# Patient Record
Sex: Male | Born: 1985 | Race: White | Hispanic: No | Marital: Married | Smoking: Current every day smoker
Health system: Southern US, Community
[De-identification: ages and names within clinical notes are randomized; demographics above are authoritative.]

## PROBLEM LIST (undated history)

## (undated) DIAGNOSIS — F99 Mental disorder, not otherwise specified: Secondary | ICD-10-CM

## (undated) DIAGNOSIS — F319 Bipolar disorder, unspecified: Secondary | ICD-10-CM

## (undated) DIAGNOSIS — M549 Dorsalgia, unspecified: Secondary | ICD-10-CM

## (undated) DIAGNOSIS — M1990 Unspecified osteoarthritis, unspecified site: Secondary | ICD-10-CM

## (undated) DIAGNOSIS — G8929 Other chronic pain: Secondary | ICD-10-CM

## (undated) HISTORY — PX: EYE SURGERY: SHX253

## (undated) HISTORY — PX: FRACTURE SURGERY: SHX138

---
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
## 2005-03-26 ENCOUNTER — Emergency Department: Payer: Self-pay | Admitting: Emergency Medicine

## 2005-08-25 ENCOUNTER — Inpatient Hospital Stay (HOSPITAL_COMMUNITY): Admission: EM | Admit: 2005-08-25 | Discharge: 2005-08-28 | Payer: Self-pay | Admitting: *Deleted

## 2005-08-26 ENCOUNTER — Ambulatory Visit: Payer: Self-pay | Admitting: *Deleted

## 2005-12-12 ENCOUNTER — Inpatient Hospital Stay (HOSPITAL_COMMUNITY): Admission: EM | Admit: 2005-12-12 | Discharge: 2005-12-15 | Payer: Self-pay | Admitting: Emergency Medicine

## 2005-12-20 ENCOUNTER — Ambulatory Visit (HOSPITAL_COMMUNITY): Admission: RE | Admit: 2005-12-20 | Discharge: 2005-12-20 | Payer: Self-pay | Admitting: Orthopedic Surgery

## 2005-12-24 ENCOUNTER — Ambulatory Visit (HOSPITAL_COMMUNITY): Admission: RE | Admit: 2005-12-24 | Discharge: 2005-12-24 | Payer: Self-pay | Admitting: Orthopedic Surgery

## 2006-10-26 ENCOUNTER — Emergency Department: Payer: Self-pay | Admitting: Emergency Medicine

## 2006-11-16 ENCOUNTER — Other Ambulatory Visit: Payer: Self-pay

## 2006-11-16 ENCOUNTER — Emergency Department: Payer: Self-pay | Admitting: Emergency Medicine

## 2007-03-04 ENCOUNTER — Emergency Department: Payer: Self-pay | Admitting: Emergency Medicine

## 2007-03-06 ENCOUNTER — Ambulatory Visit: Payer: Self-pay | Admitting: Family Medicine

## 2007-03-19 ENCOUNTER — Ambulatory Visit: Payer: Self-pay | Admitting: Gastroenterology

## 2007-05-30 ENCOUNTER — Ambulatory Visit: Payer: Self-pay | Admitting: Gastroenterology

## 2007-09-08 ENCOUNTER — Emergency Department: Payer: Self-pay | Admitting: Emergency Medicine

## 2008-05-14 ENCOUNTER — Emergency Department (HOSPITAL_COMMUNITY): Admission: EM | Admit: 2008-05-14 | Discharge: 2008-05-14 | Payer: Self-pay | Admitting: Emergency Medicine

## 2010-04-07 ENCOUNTER — Emergency Department: Payer: Self-pay | Admitting: Emergency Medicine

## 2010-05-17 LAB — POCT I-STAT, CHEM 8
Calcium, Ion: 1.25 mmol/L (ref 1.12–1.32)
Chloride: 102 mEq/L (ref 96–112)
Creatinine, Ser: 1.2 mg/dL (ref 0.4–1.5)
Glucose, Bld: 105 mg/dL — ABNORMAL HIGH (ref 70–99)
Potassium: 3.6 mEq/L (ref 3.5–5.1)

## 2010-05-17 LAB — RAPID URINE DRUG SCREEN, HOSP PERFORMED
Barbiturates: NOT DETECTED
Opiates: NOT DETECTED
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
## 2011-01-31 ENCOUNTER — Ambulatory Visit: Payer: Self-pay | Admitting: Vascular Surgery

## 2011-02-12 LAB — PATHOLOGY REPORT

## 2011-11-02 ENCOUNTER — Emergency Department: Payer: Self-pay | Admitting: Internal Medicine

## 2011-11-03 ENCOUNTER — Emergency Department (HOSPITAL_COMMUNITY)
Admission: EM | Admit: 2011-11-03 | Discharge: 2011-11-04 | Disposition: A | Discharge status: Home / Self Care | Payer: Self-pay | Attending: Emergency Medicine | Admitting: Emergency Medicine

## 2011-11-03 ENCOUNTER — Encounter (HOSPITAL_COMMUNITY): Payer: Self-pay | Admitting: *Deleted

## 2011-11-03 ENCOUNTER — Emergency Department (HOSPITAL_COMMUNITY): Payer: Self-pay

## 2011-11-03 DIAGNOSIS — IMO0002 Reserved for concepts with insufficient information to code with codable children: Secondary | ICD-10-CM | POA: Insufficient documentation

## 2011-11-03 DIAGNOSIS — F29 Unspecified psychosis not due to a substance or known physiological condition: Secondary | ICD-10-CM | POA: Insufficient documentation

## 2011-11-03 DIAGNOSIS — R4182 Altered mental status, unspecified: Secondary | ICD-10-CM | POA: Insufficient documentation

## 2011-11-03 LAB — CBC
HCT: 41.4 % (ref 39.0–52.0)
Hemoglobin: 14.4 g/dL (ref 13.0–17.0)
MCH: 31.3 pg (ref 26.0–34.0)
MCHC: 34.8 g/dL (ref 30.0–36.0)
MCV: 90 fL (ref 78.0–100.0)
RDW: 13.6 % (ref 11.5–15.5)

## 2011-11-03 LAB — COMPREHENSIVE METABOLIC PANEL
Alkaline Phosphatase: 53 U/L (ref 39–117)
BUN: 16 mg/dL (ref 6–23)
Calcium: 9.3 mg/dL (ref 8.4–10.5)
Creatinine, Ser: 1.02 mg/dL (ref 0.50–1.35)
GFR calc Af Amer: >90 mL/min (ref >90)
Glucose, Bld: 90 mg/dL (ref 70–99)
Total Protein: 6.7 g/dL (ref 6.0–8.3)

## 2011-11-03 LAB — RAPID URINE DRUG SCREEN, HOSP PERFORMED
Amphetamines: POSITIVE — AB
Benzodiazepines: NOT DETECTED
Cocaine: NOT DETECTED
Opiates: NOT DETECTED

## 2011-11-03 MED ORDER — ZIPRASIDONE MESYLATE 20 MG IM SOLR
10.0000 mg | Freq: Once | INTRAMUSCULAR | Status: AC
  Administered 2011-11-03: 10 mg via INTRAMUSCULAR
  Filled 2011-11-03: qty 20

## 2011-11-03 MED ORDER — LORAZEPAM 1 MG PO TABS
1.0000 mg | ORAL_TABLET | Freq: Three times a day (TID) | ORAL | Status: DC | PRN
  Administered 2011-11-03: 1 mg via ORAL
  Filled 2011-11-03: qty 1

## 2011-11-03 MED ORDER — ACETAMINOPHEN 325 MG PO TABS
650.0000 mg | ORAL_TABLET | Freq: Once | ORAL | Status: AC
  Administered 2011-11-03: 650 mg via ORAL
  Filled 2011-11-03: qty 2

## 2011-11-03 MED ORDER — NICOTINE 21 MG/24HR TD PT24
21.0000 mg | MEDICATED_PATCH | Freq: Every day | TRANSDERMAL | Status: DC
  Filled 2011-11-03: qty 1

## 2011-11-03 NOTE — ED Notes (Signed)
Pt reports that he use to go to [facility] for methadone, but now buys it off of the street and is using  Three 10 mg a day to treat his back pain.  Nad, resting quielty

## 2011-11-03 NOTE — ED Notes (Signed)
Pt complaining of chest pain. ekg ordered per protocol. Vw, rn.

## 2011-11-03 NOTE — ED Notes (Addendum)
Pt was picked up at [facility] by [employer].  He had apparently been taken to [facility] office.  Pt became combative at [facility] and called [employer].  Pt was originally sent to [facility] after his parents took IVC papers on him because he apparently planned to jump off a bridge (according to documents [employer] brought from [place]).

## 2011-11-03 NOTE — ED Notes (Signed)
Pt moved to [id number].  Pt belongings in locker #35

## 2011-11-03 NOTE — ED Notes (Signed)
Back from CT-pt cooperated well per tech.
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
## 2011-11-03 NOTE — ED Notes (Signed)
Report given to Wille Celeste, rn in psyche ED. Vw, rn

## 2011-11-03 NOTE — ED Notes (Signed)
Pt placed in blue scrubs  

## 2011-11-03 NOTE — ED Notes (Signed)
Patient has a visitor at this time. NAD

## 2011-11-03 NOTE — ED Provider Notes (Addendum)
History     [name]: [id number]  Arrival date & time 11/03/11  1336   First MD Initiated Contact with Patient 11/03/11 1338      Chief Complaint  Patient presents with  . Medical Clearance    (Consider location/radiation/quality/duration/timing/severity/associated sxs/prior treatment) The history is provided by the patient.  pt from [facility], arrives w gpd and commitment papers.  Pt very uncooperative, agitated. Commitment papers state pt recently hearing voices, thoughts of suicide, mentioning plan to intentionally crash vehicle. Level 5 caveat/pt uncooperative w history/?mental illness.      History reviewed. No pertinent past medical history.  History reviewed. No pertinent past surgical history.  No family history on file.  History  Substance Use Topics  . Smoking status: Not on file  . Smokeless tobacco: Not on file  . Alcohol Use: Not on file      Review of Systems  Unable to perform ROS: Psychiatric disorder  level 5 caveat  Allergies  Other  Home Medications  No current outpatient prescriptions on file.  BP 140/91  Pulse 89  Temp 99.1 F (37.3 C) (Oral)  Resp 20  SpO2 100%  Physical Exam  Nursing note and vitals reviewed. Constitutional: He appears well-developed and well-nourished. No distress.  HENT:  Head: Atraumatic.  Nose: Nose normal.  Mouth/Throat: Oropharynx is clear and moist.  Eyes: Conjunctivae normal are normal. Pupils are equal, round, and reactive to light. No scleral icterus.  Neck: Neck supple. No tracheal deviation present.  Cardiovascular: Normal rate, regular rhythm, normal heart sounds and intact distal pulses.   Pulmonary/Chest: Effort normal and breath sounds normal. No accessory muscle usage. No respiratory distress. He exhibits no tenderness.  Abdominal: Soft. Bowel sounds are normal. He exhibits no distension. There is no tenderness.  Musculoskeletal: Normal range of motion. He exhibits no edema and no tenderness.    Neurological: He is alert.       Alert, moving bil extremities purposefully with good strength. Uncooperative w exam.   Skin: Skin is warm and dry. No rash noted.  Psychiatric:       Pt agitated. Insulting staff. Uncooperative.     ED Course  Procedures (including critical care time)   Labs Reviewed  CBC  COMPREHENSIVE METABOLIC PANEL  ETHANOL  URINE RAPID DRUG SCREEN (HOSP PERFORMED)    Results for orders placed during the hospital encounter of 11/03/11  CBC      Component Value Range   WBC 7.7  4.0 - 10.5 K/uL   RBC 4.60  4.22 - 5.81 MIL/uL   Hemoglobin 14.4  13.0 - 17.0 g/dL   HCT 78.2  95.6 - 21.3 %   MCV 90.0  78.0 - 100.0 fL   MCH 31.3  26.0 - 34.0 pg   MCHC 34.8  30.0 - 36.0 g/dL   RDW 08.6  57.8 - 46.9 %   Platelets 181  150 - 400 K/uL  COMPREHENSIVE METABOLIC PANEL      Component Value Range   Sodium 136  135 - 145 mEq/L   Potassium 4.2  3.5 - 5.1 mEq/L   Chloride 100  96 - 112 mEq/L   CO2 27  19 - 32 mEq/L   Glucose, Bld 90  70 - 99 mg/dL   BUN 16  6 - 23 mg/dL   Creatinine, Ser 6.29  0.50 - 1.35 mg/dL   Calcium 9.3  8.4 - 52.8 mg/dL   Total Protein 6.7  6.0 - 8.3 g/dL   Albumin 4.5  3.5 - 5.2 g/dL   AST 17  0 - 37 U/L   ALT 16  0 - 53 U/L   Alkaline Phosphatase 53  39 - 117 U/L   Total Bilirubin 0.5  0.3 - 1.2 mg/dL   GFR calc non Af Amer >90  >90 mL/min   GFR calc Af Amer >90  >90 mL/min  ETHANOL      Component Value Range   Alcohol, Ethyl (B) <11  0 - 11 mg/dL       MDM  Pt attempted to be calm by ed staff and gpd, pt remains agitated/aggressive/uncooperative. geodon 10 mg im for agitation/psychosis.  Labs. Act consult. Will get telepsych eval.  Recheck calmer, alert. Nad.  telepsych and act eval pending. Signed out to Dr Jeraldine Loots that pt will need psych eval/admit.     Date: 11/03/2011  Rate: 102  Rhythm: sinus tachycardia  QRS Axis: normal  Intervals: normal  ST/T Wave abnormalities: normal  Conduction Disutrbances:none   Narrative Interpretation:   Old EKG Reviewed: unchanged          Suzi Roots, MD 11/03/11 1518  Suzi Roots, MD 11/04/11 [phone]

## 2011-11-03 NOTE — ED Notes (Signed)
To ct-security and gpd are with pt

## 2011-11-03 NOTE — ED Notes (Signed)
Dc criteria for restraint met; pt taken out of restraint. Transferred to psyche ED  rm 35.
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
## 2011-11-03 NOTE — ED Notes (Signed)
Up to the phone 

## 2011-11-03 NOTE — ED Notes (Signed)
Sitting on the bed, appears angry.  Pt is aware that he is under IVC and discussed with him that the doctors are the only one's that can release him, pt verbalized understanding and layed down.  Pt also reports that he has back pain 10/10/ that he takes percocet for.

## 2011-11-04 ENCOUNTER — Emergency Department (HOSPITAL_COMMUNITY)
Admission: EM | Admit: 2011-11-04 | Discharge: 2011-11-08 | Disposition: A | Discharge status: Home / Self Care | Payer: Self-pay | Attending: Emergency Medicine | Admitting: Emergency Medicine

## 2011-11-04 ENCOUNTER — Emergency Department (HOSPITAL_COMMUNITY): Payer: Self-pay

## 2011-11-04 ENCOUNTER — Emergency Department (HOSPITAL_COMMUNITY)
Admission: EM | Admit: 2011-11-04 | Discharge: 2011-11-04 | Disposition: A | Discharge status: Home / Self Care | Payer: Self-pay | Attending: Emergency Medicine | Admitting: Emergency Medicine

## 2011-11-04 ENCOUNTER — Encounter (HOSPITAL_COMMUNITY): Payer: Self-pay | Admitting: Emergency Medicine

## 2011-11-04 DIAGNOSIS — F1910 Other psychoactive substance abuse, uncomplicated: Secondary | ICD-10-CM | POA: Insufficient documentation

## 2011-11-04 DIAGNOSIS — Z0389 Encounter for observation for other suspected diseases and conditions ruled out: Secondary | ICD-10-CM | POA: Insufficient documentation

## 2011-11-04 DIAGNOSIS — Z046 Encounter for general psychiatric examination, requested by authority: Secondary | ICD-10-CM | POA: Insufficient documentation

## 2011-11-04 DIAGNOSIS — F29 Unspecified psychosis not due to a substance or known physiological condition: Secondary | ICD-10-CM | POA: Insufficient documentation

## 2011-11-04 HISTORY — DX: Mental disorder, not otherwise specified: F99

## 2011-11-04 LAB — RAPID URINE DRUG SCREEN, HOSP PERFORMED
Benzodiazepines: NOT DETECTED
Cocaine: NOT DETECTED
Opiates: NOT DETECTED

## 2011-11-04 LAB — COMPREHENSIVE METABOLIC PANEL
BUN: 13 mg/dL (ref 6–23)
CO2: 28 mEq/L (ref 19–32)
Calcium: 9.8 mg/dL (ref 8.4–10.5)
Chloride: 102 mEq/L (ref 96–112)
Creatinine, Ser: 0.93 mg/dL (ref 0.50–1.35)
GFR calc non Af Amer: >90 mL/min (ref >90)
Total Bilirubin: 0.5 mg/dL (ref 0.3–1.2)

## 2011-11-04 LAB — CBC
Hemoglobin: 14.4 g/dL (ref 13.0–17.0)
Platelets: 186 10*3/uL (ref 150–400)
RBC: 4.61 MIL/uL (ref 4.22–5.81)
WBC: 6.8 10*3/uL (ref 4.0–10.5)

## 2011-11-04 LAB — ETHANOL: Alcohol, Ethyl (B): <11 mg/dL (ref 0–11)

## 2011-11-04 MED ORDER — LORAZEPAM 1 MG PO TABS
1.0000 mg | ORAL_TABLET | Freq: Three times a day (TID) | ORAL | Status: DC | PRN
  Administered 2011-11-04 – 2011-11-07 (×4): 1 mg via ORAL
  Filled 2011-11-04 (×6): qty 1

## 2011-11-04 MED ORDER — ACETAMINOPHEN 325 MG PO TABS
650.0000 mg | ORAL_TABLET | ORAL | Status: DC | PRN
Start: 2011-11-04 — End: 2011-11-08
  Administered 2011-11-06: 650 mg via ORAL
  Filled 2011-11-04: qty 2

## 2011-11-04 NOTE — ED Notes (Signed)
Pt in room talking w. Security laughing, smiling.

## 2011-11-04 NOTE — BH Assessment (Signed)
Assessment Note   James Walsh is an 26 y.o. male who presents involuntarily to [facility] Emergency Department with the c/o of persecutory delusions and manic behaviors. Patient's parents provided clinician with background information in regards to patient's current behaviors. Patient's mother reported that since this past Wednesday pt has exhibited paranoia, persecutory delusions, and suicidal ideations stating that he desires to end his life by driving his vehicle off of a bridge. Patient's mother reported that patient has stated that his parents have cameras in his house, watching his every move. Patient's mother stated that patient has received inpatient treatment before at [facility] due to depression. Per his mother, his uncle who he was extremely close to committed suicide when [place] was 26 years old. Patient's father reported that pt has been saying bizarre statements, referring to individuals that he has not seen or spoken to in several years. "He asked me this morning. "Do you want me to apologize to Lequita Halt?" He has not dated her since high school and it was unusual for him to say something like that".  Patient's mother reported that pt exhibits depressive symptoms, evidenced by insomnia, lack of appetite, and social isolation. Per patient's father pt has been abusing methadone, adderal, and xanax with no prescription provided by a MD. Patient's mother reported that pt has a girlfriend who is expecting twins. Patient's mother is concerned about pt's recent change in memory and overall demeanor. Patient has a noted history in the past of writing a suicidal note to his parents, delineating that he cannot do this [life] anymore. Patient is unable to contract for safety and requires further evaluation and treatment at this time. Patient denies HI/AVH.     Axis I: Bipolar, mixed and Polysubstance Dependence Axis II: Deferred Axis III: History reviewed. No pertinent past  medical history. Axis IV: other psychosocial or environmental problems, problems related to social environment and problems with primary support group Axis V: 41-50 serious symptoms  Past Medical History: History reviewed. No pertinent past medical history.  History reviewed. No pertinent past surgical history.  Family History: No family history on file.  Social History:  does not have a smoking history on file. He does not have any smokeless tobacco history on file. He reports that he drinks about 1.2 ounces of alcohol per week. He reports that he uses illicit drugs.  Additional Social History:  Alcohol / Drug Use Pain Medications: See MAR  Prescriptions: See MAR- Mother reports pt has been abusing xanax and adderall Over the Counter: See MAR  History of alcohol / drug use?: Yes Substance #1 Name of Substance 1: Xanax 1 - Age of First Use: Unknown 1 - Amount (size/oz): Unknown 1 - Frequency: Daily 1 - Duration: Unknown 1 - Last Use / Amount: Unknown Substance #2 Name of Substance 2: THC 2 - Age of First Use: Unknown 2 - Amount (size/oz): Unknown 2 - Frequency: Unknown 2 - Duration: Unknown 2 - Last Use / Amount: Pt denies. UDS is positive for it Substance #3 Name of Substance 3: Amphetamines 3 - Age of First Use: unknown 3 - Amount (size/oz): unknown 3 - Frequency: unknown 3 - Duration: unknown 3 - Last Use / Amount: Positve UDS  CIWA: CIWA-Ar BP: 117/82 mmHg Pulse Rate: 81  COWS:    Allergies:  Allergies  Allergen Reactions  . Other Other (See Comments)    Patient was ask if he has any allergies to medication or food. He stated that he was  allergic to "everything" with no specific names given. It is unknown if he has allergies.    Home Medications:  (Not in a hospital admission)  OB/GYN Status:  No LMP for male patient.  General Assessment Data Location of Assessment: [facility] ED Living Arrangements: Spouse/significant other Can pt return to current living  arrangement?: Yes Admission Status: Involuntary Is patient capable of signing voluntary admission?: No Transfer from: Acute Hospital Referral Source:  Vesta Mixer)     Risk to self Suicidal Ideation: Yes-Currently Present Suicidal Intent: No-Not Currently/Within Last 6 Months Is patient at risk for suicide?: Yes Suicidal Plan?: Yes-Currently Present Specify Current Suicidal Plan: Plan to drive his vehicle off bridge Access to Means: Yes Specify Access to Suicidal Means: Vehicle What has been your use of drugs/alcohol within the last 12 months?: Pt denies SA use  Previous Attempts/Gestures: Yes How many times?: 1  Other Self Harm Risks: Uses Xanax and Adderall without prescription Triggers for Past Attempts: Family contact (Uncle committed suicide. Depressed from that occurrence) Intentional Self Injurious Behavior: None Family Suicide History: Yes (Uncle committed suicide when pt was 69) Recent stressful life event(s): Financial Problems;Other (Comment) (pt's gf is having twins) Persecutory voices/beliefs?: Yes Depression: Yes Depression Symptoms: Tearfulness;Feeling angry/irritable;Insomnia Substance abuse history and/or treatment for substance abuse?: Yes Suicide prevention information given to non-admitted patients: Not applicable  Risk to Others Homicidal Ideation: No Thoughts of Harm to Others: No Current Homicidal Intent: No Current Homicidal Plan: No Access to Homicidal Means: No Identified Victim: None reported History of harm to others?: Yes Assessment of Violence: In past 6-12 months Violent Behavior Description: Was restrained during yesterday's admission Does patient have access to weapons?: No Criminal Charges Pending?: No Does patient have a court date: No  Psychosis Hallucinations: None noted Delusions: Persecutory (Pt believes his family is out to get him. )  Mental Status Report Appear/Hygiene: Disheveled Eye Contact: Poor Motor Activity: Freedom of  movement Speech: Logical/coherent Level of Consciousness: Quiet/awake Mood: Depressed;Anxious Affect: Appropriate to circumstance;Anxious;Depressed Anxiety Level: Moderate Thought Processes: Relevant Judgement: Impaired Orientation: Person;Place;Time;Situation Obsessive Compulsive Thoughts/Behaviors: Moderate  Cognitive Functioning Concentration: Decreased Memory: Recent Impaired;Remote Intact IQ: Average Insight: Poor Impulse Control: Poor Appetite: Poor Weight Loss:  (Mother reports he has lost several pounds) Weight Gain: 0  Sleep: Decreased Total Hours of Sleep: 2  Vegetative Symptoms: None  ADLScreening [facility] Assessment Services) Patient's cognitive ability adequate to safely complete daily activities?: Yes Patient able to express need for assistance with ADLs?: Yes Independently performs ADLs?: Yes (appropriate for developmental age)  Abuse/Neglect [facility]) Physical Abuse: Denies Verbal Abuse: Denies Sexual Abuse: Denies  Prior Inpatient Therapy Prior Inpatient Therapy: Yes Prior Therapy Dates: 2001 Prior Therapy Facilty/Provider(s): [facility] Reason for Treatment: Depression  Prior Outpatient Therapy Prior Outpatient Therapy: Yes Prior Therapy Dates: 2001 Prior Therapy Facilty/Provider(s): Unknown Reason for Treatment: Depression  ADL Screening (condition at time of admission) Patient's cognitive ability adequate to safely complete daily activities?: Yes Patient able to express need for assistance with ADLs?: Yes Independently performs ADLs?: Yes (appropriate for developmental age) Weakness of Legs: None Weakness of Arms/Hands: None  Home Assistive Devices/Equipment Home Assistive Devices/Equipment: None    Abuse/Neglect Assessment (Assessment to be complete while patient is alone) Physical Abuse: Denies Verbal Abuse: Denies Sexual Abuse: Denies Exploitation of patient/patient's resources: Denies Self-Neglect: Denies Values / Beliefs Cultural Requests  During Hospitalization: None Spiritual Requests During Hospitalization: None Consults Spiritual Care Consult Needed: No Social Work Consult Needed: No      Additional Information 1:1 In Past  12 Months?: No CIRT Risk: No Elopement Risk: No Does patient have medical clearance?: Yes     Disposition: Recommendation for further evaluation with Specialist on Call and inpatient treatment. Disposition Disposition of Patient: Inpatient treatment program Type of inpatient treatment program: Adult  On Site Evaluation by:   Reviewed with Physician:     Paulino Door, Earl Lites C 11/04/2011 3:26 PM

## 2011-11-04 NOTE — ED Notes (Signed)
[id number]> Expected date:11/04/11<BR> Expected time:<BR> Means of arrival:<BR> Comments:<BR> IVC per [employer]

## 2011-11-04 NOTE — ED Notes (Addendum)
Sitting in room, tearful, angy refuses dinner, refuses ativan, reasurred. Pt had spoken to his mother on the phone  And had become angry and tearful.
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
## 2011-11-04 NOTE — ED Notes (Signed)
sitting in room crying.  Pt reports he "just wants to go...see my girlfriend...see my real family.  Pt declined ativan "I don;t know if it's really ativan."  Reasurred

## 2011-11-04 NOTE — ED Notes (Signed)
Calmer, declined to take ativan, nad, smiling

## 2011-11-04 NOTE — ED Notes (Signed)
CSW was contacted by Consulting civil engineer in regards to a phone call received by pt's mother. Pt's mother reported to Charge RN that she was concerned about pt being d/c last night due to his aggressive behavior and paranoia. Pt's mother reported to Charge RN that she would like to speak with whomever was involved in pt's care last night during his ED admission. Consulting civil engineer and CSW telephoned pt's mother to address her concerns. Pt's mother verbalized to Consulting civil engineer and CSW that pt continues to exhibit paranoia in addition to aggressive behavior now at the home. Pt's mother was tearful and emotionally distressed during the conversation. Consulting civil engineer and CSW encouraged pt's mother to bring pt back to [facility] Emergency Department for additional evaluation and treatment. Pt's mother stated that pt will not go back to the ED because he feels that he does not have any physical issues. Consulting civil engineer and CSW overheard pt in the background yelling "Who are you talking to?!! Why are you plotting against me?!?!" Charge RN and CSW verbalized safety concerns of the pt's mother while she is in the home with pt. Charge RN received permission from pt's mother to contact [employer] in order to send out assistance to the home and bring pt in for further evaluation. Charge RN contacted onsite [employer] officer who coordinated dispatching a [facility] to the residence to provide assistance. Consulting civil engineer and CSW will remain available to provide assistance upon arrival of pt and/or pt's mother.   Janann Colonel., MSW, [facility] Clinical Social Worker [phone]

## 2011-11-04 NOTE — ED Notes (Signed)
Followed up with mother at request of [employer] office to make sure someone has arrived to assist. She has called 911 herself  Twice due to pts behavior and safety. Mother distressed, pt in background yelling. Mother is planning to call again after this phone call. Plan to remain in contact with mother until arrival of pt.

## 2011-11-04 NOTE — ED Notes (Signed)
Made on call MD aware of information related to methadone clinic per patients mother.

## 2011-11-04 NOTE — ED Provider Notes (Signed)
Per RN - patient significantly more cooperative.  AOx3.  Per Psych - the patient will be d/c.    Gerhard Munch, MD 11/04/11 [phone]

## 2011-11-04 NOTE — ED Notes (Signed)
Patient mother called to make staff aware of seizure hx, and patient has been attending methadone clinic ([facility]) [address]. Has not been there in a couple of weeks.

## 2011-11-04 NOTE — ED Notes (Signed)
Pt's belongings placed in locker 35. 2 bags.

## 2011-11-04 NOTE — ED Notes (Addendum)
Mom's cell [phone]

## 2011-11-04 NOTE — ED Notes (Signed)
telepsych info called in to [facility]

## 2011-11-04 NOTE — ED Notes (Signed)
Patient discharge to home with steady gait. Respirations equal and unlabored. Skin warm and dry. No acute distress noted.

## 2011-11-04 NOTE — ED Notes (Signed)
I have place the patient in Blue scrubs size medium and while in bathroom he gave me some urine. In his patient belonging bag I have placed blue jeans, 2 belts, hat, orange T-shirt, black socks, 2 pair of shoes and boxer shorts. His 2 bags are at the nursing station.

## 2011-11-04 NOTE — ED Notes (Signed)
[id number]> Expected date:<BR> Expected time:<BR> Means of arrival:<BR> Comments:<BR> hold
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
## 2011-11-04 NOTE — ED Notes (Signed)
Tele psych MD called for evaluation with patient. Made MD aware that this will be postpone to 9/30, It will be a face to face ( ? Parent (mother)  To be involved.  Made charge nurse aware and tele psych. MD stated call back if needed

## 2011-11-04 NOTE — ED Notes (Signed)
Followed up with mother, she was yelling he has run out the and hung up the phone. Unable to verify if [facility] has arrived.

## 2011-11-04 NOTE — ED Provider Notes (Signed)
History     [name]: [id number]  Arrival date & time 11/04/11  1338   First MD Initiated Contact with Patient 11/04/11 1410     Chief complaint:  Strange behavior   (Consider location/radiation/quality/duration/timing/severity/associated sxs/prior treatment) The history is provided by the patient and a parent. The history is limited by the condition of the patient.  pt with ?hx bipolar disorder presents w parents/law enforcement. Pt was in ED yesterday w ivc per parents, on psychiatrist eval was released from ivc and sent home. Parents concerned as psychiatrist had not discussed pt with them, and pt denies any issues/symptoms. Parents state in past week has had paranoid thoughts, feeling others are spying on him, taking his things, taking his money. Has threatened to harm self, made recurrent references to wanting it all to end.  Has been unable to function at work. Has exhibit for him odd behaviors including picking up objects/trash and putting in pockets. Pt/family deny recent known drug or alcohol abuse. Denies any recent unusual stressors. No recent illness. Pt denies any c/o. No headaches. No cp or sob. No abd pain. No nvd. Normal appetite. Level 5 caveat, pt uncooperative w hx.   No past medical history on file.  No past surgical history on file.  No family history on file.  History  Substance Use Topics  . Smoking status: Not on file  . Smokeless tobacco: Not on file  . Alcohol Use: Not on file      Review of Systems  Unable to perform ROS: Psychiatric disorder  level 5 caveat. Pt denies any c/o.   Allergies  Other  Home Medications   Current Outpatient Rx  Name Route Sig Dispense Refill  . OXYCODONE-ACETAMINOPHEN 7.5-325 MG PO TABS Oral Take 1 tablet by mouth every 4 (four) hours as needed. For pain.    Marland Kitchen OXYMETAZOLINE HCL 0.05 % NA SOLN Nasal Place 2 sprays into the nose 2 (two) times daily as needed. For nasal congestion.      BP 117/82  Pulse 81  Temp 98.6 F (37  C) (Oral)  Resp 16  SpO2 100%  Physical Exam  Nursing note and vitals reviewed. Constitutional: He is oriented to person, place, and time. He appears well-developed and well-nourished. No distress.  HENT:  Head: Atraumatic.  Eyes: Conjunctivae normal are normal. Pupils are equal, round, and reactive to light.  Neck: Neck supple. No tracheal deviation present.       No stiffness or rigidity  Cardiovascular: Normal rate, regular rhythm, normal heart sounds and intact distal pulses.   Pulmonary/Chest: Effort normal and breath sounds normal. No accessory muscle usage. No respiratory distress.  Abdominal: Soft. Bowel sounds are normal. He exhibits no distension. There is no tenderness.  Musculoskeletal: Normal range of motion. He exhibits no edema and no tenderness.  Neurological: He is alert and oriented to person, place, and time.       Motor intact bil. Steady gait.   Skin: Skin is warm and dry.  Psychiatric:       Pt appears anxious. Denies si.     ED Course  Procedures (including critical care time)  Results for orders placed during the hospital encounter of 11/04/11  COMPREHENSIVE METABOLIC PANEL      Component Value Range   Sodium 139  135 - 145 mEq/L   Potassium 4.7  3.5 - 5.1 mEq/L   Chloride 102  96 - 112 mEq/L   CO2 28  19 - 32 mEq/L   Glucose, Bld  74  70 - 99 mg/dL   BUN 13  6 - 23 mg/dL   Creatinine, Ser 4.09  0.50 - 1.35 mg/dL   Calcium 9.8  8.4 - 81.1 mg/dL   Total Protein 7.0  6.0 - 8.3 g/dL   Albumin 4.6  3.5 - 5.2 g/dL   AST 21  0 - 37 U/L   ALT 17  0 - 53 U/L   Alkaline Phosphatase 57  39 - 117 U/L   Total Bilirubin 0.5  0.3 - 1.2 mg/dL   GFR calc non Af Amer >90  >90 mL/min   GFR calc Af Amer >90  >90 mL/min  CBC      Component Value Range   WBC 6.8  4.0 - 10.5 K/uL   RBC 4.61  4.22 - 5.81 MIL/uL   Hemoglobin 14.4  13.0 - 17.0 g/dL   HCT 91.4  78.2 - 95.6 %   MCV 90.9  78.0 - 100.0 fL   MCH 31.2  26.0 - 34.0 pg   MCHC 34.4  30.0 - 36.0 g/dL   RDW  21.3  08.6 - 57.8 %   Platelets 186  150 - 400 K/uL  ETHANOL      Component Value Range   Alcohol, Ethyl (B) <11  0 - 11 mg/dL  URINE RAPID DRUG SCREEN (HOSP PERFORMED)      Component Value Range   Opiates NONE DETECTED  NONE DETECTED   Cocaine NONE DETECTED  NONE DETECTED   Benzodiazepines NONE DETECTED  NONE DETECTED   Amphetamines POSITIVE (*) NONE DETECTED   Tetrahydrocannabinol POSITIVE (*) NONE DETECTED   Barbiturates NONE DETECTED  NONE DETECTED   Ct Head Wo Contrast  11/04/2011  *RADIOLOGY REPORT*  Clinical Data: Frontal headache and altered mental status.  CT HEAD WITHOUT CONTRAST  Technique:  Contiguous axial images were obtained from the base of the skull through the vertex without contrast.  Comparison: 1 day prior and 12/12/2005  Findings: Bone windows demonstrate right orbital floor reconstruction, as before.  Sinuses and mastoid air cells otherwise unremarkable.  Soft tissue windows demonstrate no  mass lesion, hemorrhage, hydrocephalus, acute infarct, intra-axial, or extra-axial fluid collection.  IMPRESSION:  1. No acute intracranial abnormality. 2.  Right orbital floor reconstruction, as before.   Original Report Authenticated By: Consuello Bossier, M.D.           MDM  Labs.  Move to psych ed. telepsych consult. Act consult.   Reviewed nursing notes and prior charts for additional history.   Despite remote hx bipolar disorder, parents deny any prior same symptoms, hx psychosis or schizophrenia. With mental status changes today, will add head ct to workup.   Ct neg.  Recheck calm, alert.  Act team called re psych placement.            Suzi Roots, MD 11/04/11 [phone]

## 2011-11-04 NOTE — ED Notes (Signed)
Patient mother called and was concerned regarding telepsych. Made her aware of changes and patient will have evaluation tomorrow. 11/05/11

## 2011-11-04 NOTE — ED Notes (Signed)
Patient brought in by [facility] office with handcuffs on per protocol. Presently patient is cooperative however, non-verbal language would indicate angry & upset (can see teeth clinch, tension on face & tears in eyes). At this time patient wishes to sit on the edge of the stretcher even though offered to raise the head of the stretcher for comfort.

## 2011-11-05 DIAGNOSIS — F29 Unspecified psychosis not due to a substance or known physiological condition: Secondary | ICD-10-CM

## 2011-11-05 DIAGNOSIS — F1910 Other psychoactive substance abuse, uncomplicated: Secondary | ICD-10-CM

## 2011-11-05 MED ORDER — RISPERIDONE 0.5 MG PO TABS
0.5000 mg | ORAL_TABLET | Freq: Once | ORAL | Status: AC
  Administered 2011-11-05: 0.5 mg via ORAL
  Filled 2011-11-05: qty 1

## 2011-11-05 MED ORDER — RISPERIDONE 0.5 MG PO TABS
ORAL_TABLET | ORAL | Status: AC
  Filled 2011-11-05: qty 1

## 2011-11-05 NOTE — ED Notes (Signed)
Staff member went out the secured doors of the Psych ED and patient charged the door and went outside of the psych ED. Security and staff member stopped patient and patient was brought back into the psych ED. Patient went back to his room and punched the wall.

## 2011-11-05 NOTE — ED Notes (Signed)
Patient went to bathroom and came to window to ask to use the phone. Was informed that phone usage was only allowed from 9a-9p. Patient smirked and walked back to room

## 2011-11-05 NOTE — ED Notes (Signed)
Reassessed the hole in wall- not a fist punch to wall, the door handle punctured wall when patient slammed it. Patient took medication but remained mad and upset but currently resting now will cont to monitor.

## 2011-11-05 NOTE — ED Notes (Addendum)
Patient woke up around  0230 and request to use phone. Patient went back to room and at 0310 hurt outburst from patient room. Cursing stating he wanted to use the phone. Escorted by another nurse. Assign nurse went to room explained the telephone rules. He is extremely agitated, has just punched a hole in the wall. Patient states he does not belong here. Made Dr pickering aware , he will review chart for further recommendation.

## 2011-11-05 NOTE — ED Notes (Signed)
Dr. Henrene Hawking notified that the patient's mother was requesting to see him. Clydie Braun, ACT team informed Dr. Henrene Hawking that patient had signed an informed consent to share information with his mother. Clydie Braun from ACT team went to talk with the mother.

## 2011-11-05 NOTE — Consult Note (Signed)
Reason for Consult: substance induced psychosis Referring Physician: Dr. Bronson Ing James Walsh is an 26 y.o. male.  HPI: Patient was seen and chart reviewed. Patient was BIB [facility] with IVC from his parents for persecutory delusions and manic behaviors. He was initially sent home and then he started acting paranoid and ran away from police into woods and texted his sister consider that he was dead. Patient believes that his family is putting some thing in his drink. He stated that his family is playing with his mind and he wants to play with them etc. He was found with inappropriate laughing from time to time. He denied SI/HI. He was emotional and dysphoric when he was informed that he was not ready to be released from hospital. Patient has been abusing drugs, most common drugs are cocaine, oxycontine, methadone, adderall  and xanax, has previous hospitalizations as teenager to [facility].   History reviewed. No pertinent past medical history.  History reviewed. No pertinent past surgical history.  No family history on file.  Social History:  does not have a smoking history on file. He does not have any smokeless tobacco history on file. He reports that he drinks about 1.2 ounces of alcohol per week. He reports that he uses illicit drugs.  Allergies:  Allergies  Allergen Reactions  . Other Other (See Comments)    Patient was ask if he has any allergies to medication or food. He stated that he was allergic to "everything" with no specific names given. It is unknown if he has allergies.    Medications: I have reviewed the patient's current medications.  Results for orders placed during the hospital encounter of 11/04/11 (from the past 48 hour(s))  URINE RAPID DRUG SCREEN (HOSP PERFORMED)     Status: Abnormal   Collection Time   11/04/11  2:43 PM      Component Value Range Comment   Opiates NONE DETECTED  NONE DETECTED    Cocaine NONE DETECTED  NONE DETECTED    Benzodiazepines NONE DETECTED  NONE  DETECTED    Amphetamines POSITIVE (*) NONE DETECTED    Tetrahydrocannabinol POSITIVE (*) NONE DETECTED    Barbiturates NONE DETECTED  NONE DETECTED   COMPREHENSIVE METABOLIC PANEL     Status: Normal   Collection Time   11/04/11  3:20 PM      Component Value Range Comment   Sodium 139  135 - 145 mEq/L    Potassium 4.7  3.5 - 5.1 mEq/L    Chloride 102  96 - 112 mEq/L    CO2 28  19 - 32 mEq/L    Glucose, Bld 74  70 - 99 mg/dL    BUN 13  6 - 23 mg/dL    Creatinine, Ser 1.61  0.50 - 1.35 mg/dL    Calcium 9.8  8.4 - 09.6 mg/dL    Total Protein 7.0  6.0 - 8.3 g/dL    Albumin 4.6  3.5 - 5.2 g/dL    AST 21  0 - 37 U/L    ALT 17  0 - 53 U/L    Alkaline Phosphatase 57  39 - 117 U/L    Total Bilirubin 0.5  0.3 - 1.2 mg/dL    GFR calc non Af Amer >90  >90 mL/min    GFR calc Af Amer >90  >90 mL/min   CBC     Status: Normal   Collection Time   11/04/11  3:20 PM      Component Value  Range Comment   WBC 6.8  4.0 - 10.5 K/uL    RBC 4.61  4.22 - 5.81 MIL/uL    Hemoglobin 14.4  13.0 - 17.0 g/dL    HCT 16.1  09.6 - 04.5 %    MCV 90.9  78.0 - 100.0 fL    MCH 31.2  26.0 - 34.0 pg    MCHC 34.4  30.0 - 36.0 g/dL    RDW 40.9  81.1 - 91.4 %    Platelets 186  150 - 400 K/uL   ETHANOL     Status: Normal   Collection Time   11/04/11  3:20 PM      Component Value Range Comment   Alcohol, Ethyl (B) <11  0 - 11 mg/dL     Ct Head Wo Contrast  11/04/2011  *RADIOLOGY REPORT*  Clinical Data: Frontal headache and altered mental status.  CT HEAD WITHOUT CONTRAST  Technique:  Contiguous axial images were obtained from the base of the skull through the vertex without contrast.  Comparison: 1 day prior and 12/12/2005  Findings: Bone windows demonstrate right orbital floor reconstruction, as before.  Sinuses and mastoid air cells otherwise unremarkable.  Soft tissue windows demonstrate no  mass lesion, hemorrhage, hydrocephalus, acute infarct, intra-axial, or extra-axial fluid collection.  IMPRESSION:  1. No acute  intracranial abnormality. 2.  Right orbital floor reconstruction, as before.   Original Report Authenticated By: Consuello Bossier, M.D.    Ct Head Wo Contrast  11/03/2011  *RADIOLOGY REPORT*  Clinical Data: Altered mental status, recent head trauma  CT HEAD WITHOUT CONTRAST  Technique:  Contiguous axial images were obtained from the base of the skull through the vertex without contrast.  Comparison: 12/12/2005  Findings: No evidence of parenchymal hemorrhage or extra-axial fluid collection. No mass lesion, mass effect, or midline shift.  No CT evidence of acute infarction.  Cerebral volume is age appropriate.  No ventriculomegaly.  The visualized paranasal sinuses are essentially clear. The mastoid air cells are unopacified.  Prior right orbital floor reconstruction.  No evidence of acute calvarial fracture.  IMPRESSION: Prior right orbital floor reconstruction.  Otherwise normal head CT.   Original Report Authenticated By: Charline Bills, M.D.     Positive for aggressive behavior, anxiety, bad mood, behavior problems and illegal drug usage Blood pressure 100/63, pulse 80, temperature 98 F (36.7 C), temperature source Oral, resp. rate 18, SpO2 98.00%.   Assessment/Plan: Polysubstance abuse vs dependence(amphetamine and THC) Substance induced psychosis Bipolar disorder by history  Recommend acute psychiatric hospitalization for safety and crisis stabilization.   Jurni Cesaro,JANARDHAHA R. 11/05/2011, 5:38 PM

## 2011-11-05 NOTE — ED Notes (Signed)
Patient observed on video monitor punching the wall x 1 while lying in the bed. Continuing to watch.

## 2011-11-05 NOTE — BH Assessment (Signed)
Assessment Note   James Walsh is an 26 y.o. male. Pt initially presented 11/03/11 c/o of persecutory delusions and manic behaviors. Patient's parents provided initial clinician with background information in regards to patient's current behaviors. Patient's mother reported that since this past Wednesday pt has exhibited paranoia, persecutory delusions, and suicidal ideations stating that he desires to end his life by driving his vehicle off of a bridge. Patient's mother reported that patient has stated that his parents have cameras in his house, watching his every move. Patient's mother stated that patient has received inpatient treatment before at [facility] due to depression. Per his mother, his uncle who he was extremely close to committed suicide when [place] was 26 years old. Patient's father reported that pt has been saying bizarre statements, referring to individuals that he has not seen or spoken to in several years. "He asked me this morning. "Do you want me to apologize to Lequita Halt?" He has not dated her since high school and it was unusual for him to say something like that". Patient's mother reported that pt exhibits depressive symptoms, evidenced by insomnia, lack of appetite, and social isolation. Per patient's father pt has been abusing methadone, adderal, and xanax with no prescription provided by a MD. Patient's mother reported that pt has a girlfriend who is expecting twins. Patient's mother is concerned about pt's recent change in memory and overall demeanor. Patient has a noted history in the past of writing a suicidal note to his parents, delineating that he cannot do this [life] anymore. Patient is unable to contract for safety and requires further evaluation and treatment at this time. Patient denies HI/AVH.   Pt was had telepsych and was cleared for d/c. Pt then returned home and was brought back under IVC. Mother reported to ED staff that pt is not being truthful  about what is going on. Pt stated "this is about the pills, isn't it"? Pt admitted to using "a couple of methadone pills" on occasion, but stated he did not want treatment for this, as he was "feeling better and I probably won't use again". Pt denies SI, HI or psychosis. Pt pending psychiatric evaluation for further recommendation.  Axis I: Substance Abuse and Substance Induced Mood Disorder Axis II: Deferred Axis III: History reviewed. No pertinent past medical history. Axis IV: other psychosocial or environmental problems, problems related to social environment and problems with primary support group Axis V: 41-50 serious symptoms  Past Medical History: History reviewed. No pertinent past medical history.  History reviewed. No pertinent past surgical history.  Family History: No family history on file.  Social History:  does not have a smoking history on file. He does not have any smokeless tobacco history on file. He reports that he drinks about 1.2 ounces of alcohol per week. He reports that he uses illicit drugs.  Additional Social History:  Alcohol / Drug Use Pain Medications: See MAR  Prescriptions: See MAR- Mother reports pt has been abusing xanax and adderall Over the Counter: See MAR  History of alcohol / drug use?: Yes Substance #1 Name of Substance 1: Xanax 1 - Age of First Use: Unknown 1 - Amount (size/oz): Unknown 1 - Frequency: Daily 1 - Duration: Unknown 1 - Last Use / Amount: Unknown Substance #2 Name of Substance 2: THC 2 - Age of First Use: Unknown 2 - Amount (size/oz): Unknown 2 - Frequency: Unknown 2 - Duration: Unknown 2 - Last Use / Amount: Pt denies. UDS is positive  for it Substance #3 Name of Substance 3: Amphetamines 3 - Age of First Use: unknown 3 - Amount (size/oz): unknown 3 - Frequency: unknown 3 - Duration: unknown 3 - Last Use / Amount: Positve UDS  CIWA: CIWA-Ar BP: 100/63 mmHg Pulse Rate: 80  COWS:    Allergies:  Allergies  Allergen  Reactions  . Other Other (See Comments)    Patient was ask if he has any allergies to medication or food. He stated that he was allergic to "everything" with no specific names given. It is unknown if he has allergies.    Home Medications:  (Not in a hospital admission)  OB/GYN Status:  No LMP for male patient.  General Assessment Data Location of Assessment: [facility] Living Arrangements: Spouse/significant other Can pt return to current living arrangement?: Yes Admission Status: Involuntary Is patient capable of signing voluntary admission?: No Transfer from: Acute Hospital Referral Source: Other ([facility])     Risk to self Suicidal Ideation: No-Not Currently/Within Last 6 Months (pt denied) Suicidal Intent: No-Not Currently/Within Last 6 Months (pt denied) Is patient at risk for suicide?: No Suicidal Plan?: No-Not Currently/Within Last 6 Months Specify Current Suicidal Plan: pt denied Access to Means: Yes Specify Access to Suicidal Means: Vehicle What has been your use of drugs/alcohol within the last 12 months?: Pt admitted to "a few pills of methadone" Previous Attempts/Gestures: Yes How many times?: 1  Other Self Harm Risks: SA Triggers for Past Attempts: Family contact;Other (Comment) (Uncle commited SI; ) Intentional Self Injurious Behavior: None Family Suicide History: Yes (Uncle - when pt was 15) Recent stressful life event(s): Financial Problems;Other (Comment) (issues w/ parents; pt's GF is having twins) Persecutory voices/beliefs?: Yes Depression: No Depression Symptoms: Feeling angry/irritable;Insomnia Substance abuse history and/or treatment for substance abuse?: Yes Suicide prevention information given to non-admitted patients: Not applicable  Risk to Others Homicidal Ideation: No Thoughts of Harm to Others: No Current Homicidal Intent: No Current Homicidal Plan: No Access to Homicidal Means: No Identified Victim: N/A History of harm to others?:  Yes Assessment of Violence: In past 6-12 months Violent Behavior Description: Restrained during last admission (11/03/11) Does patient have access to weapons?: No Criminal Charges Pending?: No Does patient have a court date: No  Psychosis Hallucinations: None noted Delusions: None noted  Mental Status Report Appear/Hygiene: Disheveled Eye Contact: Fair Motor Activity: Freedom of movement Speech: Logical/coherent Level of Consciousness: Sleeping (Was sleeping, but woke for assessment after fire alarm sound) Mood: Labile Affect: Labile (pt laughing inappropriately) Anxiety Level: Moderate Thought Processes: Relevant Judgement: Impaired Orientation: Person;Place;Time;Situation Obsessive Compulsive Thoughts/Behaviors: None  Cognitive Functioning Concentration: Normal Memory: Recent Intact;Remote Intact IQ: Average Insight: Poor Impulse Control: Poor Appetite: Poor Weight Loss:  (Reported several pounds) Weight Gain: 0  Sleep: Decreased Total Hours of Sleep: 2  Vegetative Symptoms: None  ADLScreening [facility] Assessment Services) Patient's cognitive ability adequate to safely complete daily activities?: Yes Patient able to express need for assistance with ADLs?: Yes Independently performs ADLs?: Yes (appropriate for developmental age)  Abuse/Neglect [facility]) Physical Abuse: Denies Verbal Abuse: Denies Sexual Abuse: Denies  Prior Inpatient Therapy Prior Inpatient Therapy: Yes Prior Therapy Dates: 2001 Prior Therapy Facilty/Provider(s): [facility] Reason for Treatment: Depression  Prior Outpatient Therapy Prior Outpatient Therapy: Yes Prior Therapy Dates: 2001 Prior Therapy Facilty/Provider(s): Unknown Reason for Treatment: Depression  ADL Screening (condition at time of admission) Patient's cognitive ability adequate to safely complete daily activities?: Yes Patient able to express need for assistance with ADLs?: Yes Independently performs ADLs?: Yes (appropriate  for  developmental age) Weakness of Legs: None Weakness of Arms/Hands: None  Home Assistive Devices/Equipment Home Assistive Devices/Equipment: None    Abuse/Neglect Assessment (Assessment to be complete while patient is alone) Physical Abuse: Denies Verbal Abuse: Denies Sexual Abuse: Denies Exploitation of patient/patient's resources: Denies Self-Neglect: Denies Values / Beliefs Cultural Requests During Hospitalization: None Spiritual Requests During Hospitalization: None Consults Spiritual Care Consult Needed: No Social Work Consult Needed: No      Additional Information 1:1 In Past 12 Months?: No CIRT Risk: Yes Elopement Risk: No Does patient have medical clearance?: Yes     Disposition:  Disposition Disposition of Patient: Referred to (Psychiatrist to evaluate) Type of inpatient treatment program: Adult Patient referred to: Other (Comment) (Psychiatrist to evaluate)  On Site Evaluation by:   Reviewed with Physician:     Charyl Bigger D 11/05/2011 3:10 PM

## 2011-11-05 NOTE — ED Notes (Signed)
Patient's mother visiting patient.

## 2011-11-05 NOTE — [facility] Counselor (Signed)
Spoke with Mother and Father. Pt had signed consent to release with Charge RN present. Parents shared the following information about pt. Stated he was fine on Sunday but by Wednesday he was a "different person". Reported confusion, memory loss, paranoia, smiling oddly and laughing inappropriately, and feeling that his boss and parents are against him. Mother stated pt claimed they had cameras in his house and a tracker in his truck. Stated he did not believe his girlfriend was pregnant with twins, not recalling attending the doctor visit with her. Repeatedly saying things like "make this game stop" and "I want my life back" or "I know what you are doing" to them. Stated after he was released from ED yesterday, they found him wandering the streets in his bare feet and disoriented. Father stated when pt got home he was "getting out of hand" and they had to contact the [facility] again, but pt fled into the woods to get away. While in the woods, he texted his sister from his girlfriend's phone stating "Wes is dead." [Pt reported this to psychiatrist that "my family is messing with my head, so I want to mess with theirs."] Father stated that once IVC were reissued, the [facility] had to get the dogs to track him out of the woods. Mother reports pt has hx of SA but from age 64. Stated he had been on Methadone maintenance with [facility] for 2 yrs but stopped after changing jobs, however was remaining on the same dosage but just by getting the methadone from the streets. States she knows he has been taking Adderall, Xanax with the Methadone and suspect he had taken [place] and [employer] recently, as he had access to someone with this.

## 2011-11-05 NOTE — ED Provider Notes (Signed)
  Physical Exam  BP 107/65  Pulse 65  Temp 97.8 F (36.6 C) (Oral)  Resp 15  SpO2 100%  Physical Exam  ED Course  Procedures  MDM patietn has become agitated and punched wall. Will medicate      Juliet Rude. Rubin Payor, MD 11/05/11 [phone]

## 2011-11-05 NOTE — ED Notes (Signed)
Patient yelling derogatory remarks to patient in room 37.

## 2011-11-05 NOTE — ED Provider Notes (Addendum)
Filed Vitals:   11/05/11 0601  BP: 102/65  Pulse: 102  Temp: 97.9 F (36.6 C)  Resp: 16   Pt seen and assessed. NAD. IVC. Pending placement.  Raeford Razor, MD 11/05/11 0827  Filed Vitals:   11/06/11 0528  BP: 134/87  Pulse: 91  Temp: 98 F (36.7 C)  Resp: 18   Pt assessed this am. Agitated and aggressive last night. Cooperative with me this am but laughing inappropriately at times. No new complaints. Placement pending.  Raeford Razor, MD 11/06/11 [phone]

## 2011-11-05 NOTE — ED Notes (Signed)
Patient pulled mattress onto the floor and sleeping.

## 2011-11-06 MED ORDER — OLANZAPINE 5 MG PO TABS
5.0000 mg | ORAL_TABLET | Freq: Two times a day (BID) | ORAL | Status: DC
  Filled 2011-11-06: qty 1

## 2011-11-06 MED ORDER — ONDANSETRON 4 MG PO TBDP
8.0000 mg | ORAL_TABLET | Freq: Once | ORAL | Status: AC
  Administered 2011-11-06: 8 mg via ORAL
  Filled 2011-11-06: qty 2

## 2011-11-06 MED ORDER — OLANZAPINE 5 MG PO TABS
5.0000 mg | ORAL_TABLET | ORAL | Status: AC
  Administered 2011-11-06: 5 mg via ORAL

## 2011-11-06 MED ORDER — ZIPRASIDONE MESYLATE 20 MG IM SOLR
10.0000 mg | Freq: Four times a day (QID) | INTRAMUSCULAR | Status: DC | PRN
  Administered 2011-11-06: 10 mg via INTRAMUSCULAR
  Filled 2011-11-06 (×2): qty 20

## 2011-11-06 MED ORDER — OLANZAPINE 5 MG PO TABS
5.0000 mg | ORAL_TABLET | Freq: Two times a day (BID) | ORAL | Status: DC
  Administered 2011-11-07: 5 mg via ORAL
  Filled 2011-11-06: qty 1

## 2011-11-06 NOTE — ED Notes (Signed)
Pt into shower. 

## 2011-11-06 NOTE — ED Notes (Signed)
Pt states "I don't know why I'm here, guess it's because I was taking pills, pain pills, I'm just ready to go home."

## 2011-11-06 NOTE — ED Notes (Addendum)
Pt in room kicked bedside table across the room knocking water and everything on it in the floor. Pt then picked up trash can and threw it into the wall causing a large hole in the wall, hitting code blue button. staff into room to administer IM Geodon 10 mg pt refusing to allow administration of medication and threatening to fight staff and [employer] officer.

## 2011-11-06 NOTE — ED Notes (Signed)
Pt now stating "I think I'm being controlled by Velda Shell"; questioned who that was, he stated "Sherry's grandpaw, I don't have control of my life, I don't believe anybody, they tell me I'm going home and then I don't"; informed pt do not know when he will be d/c'd but can chart his actions like being calm & cooperative, pt verbalized understanding; pt currently pacing in room.

## 2011-11-06 NOTE — Consult Note (Signed)
Reason for Consult:Substance induced mood disorder Referring Physician: Dr. Arvid Right James Walsh is an 26 y.o. male.  HPI: Patient was made two holes in the wall by throwing bed side table and trash can last night. Reportedly he was upset and angry when staff is talking about his past and he did not like about it. He continues to be anxious, delusional and paranoid. His parents are supportive and were concern about releasing from psych ER if he can speak well this am. He was more agitated when he learned his parents are here and parents left without visiting him. He was previously taken zyprexa and willing to take medication to control his paranoid delusions. He endorses using methadone from street and not [facility]. He lost his job from [employer] for unknown behaviors about three months ago. His current job started three months ago, paying some money to his GF or her uncle he owe.   History reviewed. No pertinent past medical history.  History reviewed. No pertinent past surgical history.  No family history on file.  Social History:  does not have a smoking history on file. He does not have any smokeless tobacco history on file. He reports that he drinks about 1.2 ounces of alcohol per week. He reports that he uses illicit drugs.  Allergies:  Allergies  Allergen Reactions  . Other Other (See Comments)    Patient was ask if he has any allergies to medication or food. He stated that he was allergic to "everything" with no specific names given. It is unknown if he has allergies.    Medications: I have reviewed the patient's current medications.  No results found for this or any previous visit (from the past 48 hour(s)).  No results found.  Positive for anorexia, bad mood, illegal drug usage, sleep disturbance and paranoid delusions. Blood pressure 123/71, pulse 75, temperature 98.2 F (36.8 C), temperature source Oral, resp. rate  18, SpO2 98.00%.   Assessment/Plan: Substance induced mood disorder Polysubstance dependence   Will start Zyprexa 5 mg i PO BID for controlling paranoid delusions. Patient is willing to take medication. Will keep seeking inpatient bed availability.  James Walsh,JANARDHAHA R. 11/06/2011, 3:50 PM

## 2011-11-06 NOTE — ED Notes (Signed)
Pt now stating "I know now why I'm here, because I was taking pills, I don't want the ativan or any pill, I don't want to take any more pills, I talked to my mom this morning and told her I just wanted to go home, I know now it's my fault I'm here"

## 2011-11-06 NOTE — [facility] Counselor (Addendum)
Informed by [facility] @ [facility] that Dr. Allena Katz felt pt is too acute currently for [facility], but will review again tomorrow. Also faxed info to [facility]. Spoke with Cassandra @ [facility]. Not sure if they may have an opening but stated to fax info for their review. Faxed to [facility].

## 2011-11-06 NOTE — ED Notes (Signed)
Pt crying stating "I don't want to see my mother if I can't go home with her"; informed pt will tell her, pt then stated "no, I want to see her, I just can't take it anymore"; is indecisive, doesn't believe his mother is coming since she is not already back in the dept, informed pt mother is speaking to my Director, pt eating sandwich & chips

## 2011-11-06 NOTE — ED Notes (Signed)
Pt in to bathroom, was witnessed did not vomit, Dr. Denton Lank informed pt may need something for nausea.

## 2011-11-06 NOTE — ED Notes (Signed)
Pt states "you can get me a grape drink"; informed pt will check to see if available, grape soda provided.

## 2011-11-06 NOTE — ED Notes (Signed)
Pt laughing inappropriately @ times during conversation.

## 2011-11-06 NOTE — ED Notes (Signed)
Spoke with mother of patient via phone, mother requesting pt be tested for B12 deficiency and a thyroid panel, informed mother will pass request along to Dr. Shela Commons upon his arrival to unit, mother verbalized understanding.

## 2011-11-06 NOTE — ED Notes (Addendum)
Pt pacing in hall, pt again offered ativan, pt stating "I don't know if it's real, I don't know if I can trust you, it's just wrong, I shouldn't be here, it's just wrong, you know why I'm here, you know all the people that put me here"; pt informed don't want to have to give a shot, pt's response, "a shot of water"'; pt requesting to use the phone, pt accommodated.

## 2011-11-06 NOTE — ED Notes (Signed)
Pt informed mother is here.

## 2011-11-06 NOTE — ED Notes (Signed)
Received call from mother questioning if psychiatrist had see pt, informed mother is currently in room with pt, mother responded "well, we're still out here waiting to talk to him"; informed mother would inform MD they are waiting in from lobby; mother verbalized understanding.

## 2011-11-06 NOTE — ED Notes (Signed)
Pt pacing in room, questioned pt if he needed anything, pt crying stating "no, I just need to go home"; informed pt to go home will need to demonstrate appropriate behaviors, pt verbalized understanding.

## 2011-11-06 NOTE — ED Notes (Signed)
Pt is shaking legs, holding abdomen, questioned pt is stomach was hurting, pt stated "a little, I just don't feel good"

## 2011-11-07 MED ORDER — LORAZEPAM 1 MG PO TABS
1.0000 mg | ORAL_TABLET | ORAL | Status: DC | PRN
  Administered 2011-11-08: 1 mg via ORAL
  Filled 2011-11-07: qty 1

## 2011-11-07 MED ORDER — ZIPRASIDONE MESYLATE 20 MG IM SOLR
20.0000 mg | Freq: Once | INTRAMUSCULAR | Status: AC
  Administered 2011-11-07: 20 mg via INTRAMUSCULAR

## 2011-11-07 MED ORDER — OLANZAPINE 5 MG PO TABS
10.0000 mg | ORAL_TABLET | Freq: Two times a day (BID) | ORAL | Status: DC
  Administered 2011-11-07 – 2011-11-08 (×2): 10 mg via ORAL
  Filled 2011-11-07 (×2): qty 2

## 2011-11-07 NOTE — ED Notes (Signed)
Pt is awake pacing in hallway-back to room and given warm blankets-pt continually asking"Why am I in here"-explained he has been involuntarily committed.  Pt states he does not believe that.  No other needs at this time except asking to be discharged.

## 2011-11-07 NOTE — ED Provider Notes (Signed)
Pt seen and more calm after geodon, awaiting placement  Toy Baker, MD 11/07/11 [phone]

## 2011-11-07 NOTE — Consult Note (Signed)
RI have reviewed the patient's current medications.eason for Consult: Psychosis Referring Physician: Dr. Verdell Carmine MARQUAVIUS VANVOOREN is an 26 y.o. male.  HPI: Patient was seen and chart reviewed. He has one severe episode of agitation, aggression and property destruction. Reportedly he was asked to make phone call to his mother and staff redirected him to wait for while due to it is not appropriate to make phone calls. He was paranoid, stated everyone is playing with him, made him frustrated and angry. He was received Geodon 20 mg IM to control his agitation and aggression.   History reviewed. No pertinent past medical history.  History reviewed. No pertinent past surgical history.  No family history on file.  Social History:  does not have a smoking history on file. He does not have any smokeless tobacco history on file. He reports that he drinks about 1.2 ounces of alcohol per week. He reports that he uses illicit drugs.  Allergies:  Allergies  Allergen Reactions  . Other Other (See Comments)    Patient was ask if he has any allergies to medication or food. He stated that he was allergic to "everything" with no specific names given. It is unknown if he has allergies.    Medications: He  No results found for this or any previous visit (from the past 48 hour(s)).  No results found.  Positive for aggressive behavior, anxiety, bad mood, behavior problems, depression, illegal drug usage, mood swings and sleep disturbance Blood pressure 114/75, pulse 120, temperature 97.4 F (36.3 C), temperature source Oral, resp. rate 18, SpO2 100.00%.   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  Nameer Summer,JANARDHAHA R. 11/07/2011, 2:02 PM

## 2011-11-07 NOTE — ED Notes (Signed)
At approx 0715 pt pacing hallway asked nurses what they were looking at and told them to "stop fucking looking at me", yelling, increased agitation, security and [employer] called pt went into room, security in room to assist pt with calming down. Pt in room sitting on bed, remains agitated and cursing requesting to see psych MD and be d/c. Pt educated about when he can be d/c. Pt immediately out of room and continues to curse and yell and knocked hand sanitizer dispenser off wall. Security and [employer] along with am nurse in room to assist with de-escalation, writer in room to administer geodon 20 mg IM. Pt allowed administration, after administration pt said "fuck you" to Clinical research associate. Pt in room lying in bed remains agitated but calm.

## 2011-11-07 NOTE — ED Notes (Signed)
Pt requesting medication for sleep MD informed, changed frequency on Ativan 1 mg, pt admin ativan 1 mg and zyprexa 10 mg, pt advised of MD's decision to not order any sleep med but to move up frequency of Ativan, pt ok with decision, asked to use the telephone, educated about telephone policy, pt states ok and goes back to his room and gets in bed.

## 2011-11-08 ENCOUNTER — Encounter (HOSPITAL_COMMUNITY): Payer: Self-pay | Admitting: *Deleted

## 2011-11-08 MED ORDER — OLANZAPINE 10 MG PO TABS: 10.0000 mg | ORAL_TABLET | Freq: Two times a day (BID) | ORAL | Status: DC

## 2011-11-08 NOTE — ED Notes (Signed)
Pt pacing back and forth in the hallway, is pleasant, states he is bored

## 2011-11-08 NOTE — BHH Counselor (Signed)
Pt was declined at [facility], by Dr. Allena Katz, and [facility], by Dr Les Pou, Due to acuity and aggressive behavior. There are no beds currently available at [facility].

## 2011-11-08 NOTE — BH Assessment (Signed)
Assessment Note   James Walsh is an 26 y.o. male. Initial Assessment 11/05/11:James Walsh is an 26 y.o. male. Pt initially presented 11/03/11 c/o of persecutory delusions and manic behaviors. Patient's parents provided initial clinician with background information in regards to patient's current behaviors. Patient's mother reported that since this past Wednesday pt has exhibited paranoia, persecutory delusions, and suicidal ideations stating that he desires to end his life by driving his vehicle off of a bridge. Patient's mother reported that patient has stated that his parents have cameras in his house, watching his every move. Patient's mother stated that patient has received inpatient treatment before at [facility] due to depression. Per his mother, his uncle who he was extremely close to committed suicide when [place] was 26 years old. Patient's father reported that pt has been saying bizarre statements, referring to individuals that he has not seen or spoken to in several years. "He asked me this morning. "Do you want me to apologize to James Walsh?" He has not dated her since high school and it was unusual for him to say something like that". Patient's mother reported that pt exhibits depressive symptoms, evidenced by insomnia, lack of appetite, and social isolation. Per patient's father pt has been abusing methadone, adderal, and xanax with no prescription provided by a MD. Patient's mother reported that pt has a girlfriend who is expecting twins. Patient's mother is concerned about pt's recent change in memory and overall demeanor. Patient has a noted history in the past of writing a suicidal note to his parents, delineating that he cannot do this [life] anymore. Patient is unable to contract for safety and requires further evaluation and treatment at this time. Patient denies HI/AVH.  Pt was had telepsych and was cleared for d/c. Pt then returned home and was brought back under  IVC. Mother reported to ED staff that pt is not being truthful about what is going on. Pt stated "this is about the pills, isn't it"? Pt admitted to using "a couple of methadone pills" on occasion, but stated he did not want treatment for this, as he was "feeling better and I probably won't use again". Pt denies SI, HI or psychosis. Pt pending psychiatric evaluation for further recommendation.   Reassessment-11-08-11 Pt reassessed today and reports that he had several verbal altercations with his mom,dad, and sister over the past weekend(9-28 and 9-29) reports that he cant remember the details of what happened and what started the altercation.Pt reports that his mother had him commited due to verbal altercation. Pt presents silly and laughing inappropriately at times. Pt reports that he is bored and does not understand why he is still at the hospital even after assessor explained to him regarding his behaviors,collateral info from his mother. Pt currently denies SI,HI, and no AVH. Pt referred to [facility] pending waitlist, declined at [facility] due to his acuity. Pt reevaluated and consulted by Dr. Carmelina Dane who is recommending pt be d/c home as pt no longer meet inpatient criteria, and he also recommending that pt follow up with [facility] for ongoing mental health and medication management.     Axis I: Substance Induced Mood Disorder Axis II: Deferred Axis III:  Past Medical History  Diagnosis Date  . Mental disorder    Axis IV: economic problems, housing problems, other psychosocial or environmental problems, problems related to social environment and problems with primary support group Axis V: 51-60 moderate symptoms  Past Medical History:  Past Medical History  Diagnosis Date  . Mental disorder     History reviewed. No pertinent past surgical history.  Family History: No family history on file.  Social History:  does not have a smoking history on file. He does not have any smokeless  tobacco history on file. He reports that he drinks about 1.2 ounces of alcohol per week. He reports that he uses illicit drugs.  Additional Social History:  Alcohol / Drug Use Pain Medications: See MAR  Prescriptions: See MAR- Mother reports pt has been abusing xanax and adderall Over the Counter: See MAR  History of alcohol / drug use?: Yes Substance #1 Name of Substance 1: Xanax 1 - Age of First Use: Unknown 1 - Amount (size/oz): Unknown 1 - Frequency: Daily 1 - Duration: Unknown 1 - Last Use / Amount: Unknown Substance #2 Name of Substance 2: THC 2 - Age of First Use: Unknown 2 - Amount (size/oz): Unknown 2 - Frequency: Unknown 2 - Duration: Unknown 2 - Last Use / Amount: Pt denies. UDS is positive for it Substance #3 Name of Substance 3: Amphetamines 3 - Age of First Use: unknown 3 - Amount (size/oz): unknown 3 - Frequency: unknown 3 - Duration: unknown 3 - Last Use / Amount: Positve UDS  CIWA: CIWA-Ar BP: 124/80 mmHg Pulse Rate: 87  COWS:    Allergies:  Allergies  Allergen Reactions  . Other Other (See Comments)    Patient was ask if he has any allergies to medication or food. He stated that he was allergic to "everything" with no specific names given. It is unknown if he has allergies.    Home Medications:  (Not in a hospital admission)  OB/GYN Status:  No LMP for male patient.  General Assessment Data Location of Assessment: [facility] ACT Assessment: Yes Living Arrangements: Spouse/significant other Can pt return to current living arrangement?: Yes Admission Status: Involuntary (Dr. Shela Commons recommending d/c from ER with F/U at [facility]) Is patient capable of signing voluntary admission?: Yes Transfer from: Acute Hospital Referral Source: Other     Risk to self Suicidal Ideation: No-Not Currently/Within Last 6 Months Suicidal Intent: No-Not Currently/Within Last 6 Months Is patient at risk for suicide?: No Suicidal Plan?: No-Not Currently/Within Last 6  Months Specify Current Suicidal Plan: pt denies Access to Means: No Specify Access to Suicidal Means: na What has been your use of drugs/alcohol within the last 12 months?: pt admits to using a couple methadone pills 5 days ago Previous Attempts/Gestures: Yes (verbalized self harm in the past,but states he did not inten) How many times?:  (denies acting on any thoughts) Other Self Harm Risks: none Triggers for Past Attempts: Family contact (family conflict causes verbal discord with parents and siste) Intentional Self Injurious Behavior: None Family Suicide History: Yes (uncle commited suicide when pt was 35) Recent stressful life event(s): Financial Problems (issues with parents gf is having a baby) Persecutory voices/beliefs?: Yes Depression: No Depression Symptoms: Feeling angry/irritable;Insomnia Substance abuse history and/or treatment for substance abuse?: Yes Suicide prevention information given to non-admitted patients: Not applicable  Risk to Others Homicidal Ideation: No Thoughts of Harm to Others: No Current Homicidal Intent: No Current Homicidal Plan: No Access to Homicidal Means: No Identified Victim: n/a History of harm to others?: Yes Assessment of Violence: In past 6-12 months (was restrained during last admission) Violent Behavior Description: Currently cooperative and calm, wallking the hall because he is bored Does patient have access to weapons?: No Criminal Charges Pending?: No Does patient have a court  date: No  Psychosis Hallucinations: None noted Delusions: None noted  Mental Status Report Appear/Hygiene: Disheveled Eye Contact: Fair Motor Activity: Freedom of movement Speech: Logical/coherent Level of Consciousness: Alert Mood: Anxious Affect: Anxious;Appropriate to circumstance (laughing inappropriately) Anxiety Level: Moderate Thought Processes: Coherent;Relevant Judgement: Impaired Orientation: Person;Place;Time;Situation Obsessive  Compulsive Thoughts/Behaviors: None  Cognitive Functioning Concentration: Normal Memory: Recent Intact;Remote Intact IQ: Average Insight: Poor Impulse Control: Poor Appetite: Fair Weight Loss:  (pt reports several pounds) Weight Gain: 0  Sleep: Decreased Total Hours of Sleep: 2  Vegetative Symptoms: None  ADLScreening [facility]) Patient's cognitive ability adequate to safely complete daily activities?: Yes Patient able to express need for assistance with ADLs?: Yes Independently performs ADLs?: Yes (appropriate for developmental age)  Abuse/Neglect [facility]) Physical Abuse: Denies Verbal Abuse: Denies Sexual Abuse: Denies  Prior Inpatient Therapy Prior Inpatient Therapy: Yes Prior Therapy Dates: 2001 Prior Therapy Facilty/Provider(s): [facility] Reason for Treatment: Depression  Prior Outpatient Therapy Prior Outpatient Therapy: Yes Prior Therapy Dates: 2001 Prior Therapy Facilty/Provider(s): Unknown Reason for Treatment: Depression  ADL Screening (condition at time of admission) Patient's cognitive ability adequate to safely complete daily activities?: Yes Patient able to express need for assistance with ADLs?: Yes Independently performs ADLs?: Yes (appropriate for developmental age) Weakness of Legs: None Weakness of Arms/Hands: None  Home Assistive Devices/Equipment Home Assistive Devices/Equipment: None    Abuse/Neglect Assessment (Assessment to be complete while patient is alone) Physical Abuse: Denies Verbal Abuse: Denies Sexual Abuse: Denies Exploitation of patient/patient's resources: Denies Self-Neglect: Denies Values / Beliefs Cultural Requests During Hospitalization: None Spiritual Requests During Hospitalization: None Consults Spiritual Care Consult Needed: No Social Work Consult Needed: No Merchant navy officer (For Healthcare) Advance Directive: Patient would like information;Patient does not have advance directive Nutrition Screen- [facility]  Adult/[facility] Have you recently lost weight without trying?: No Have you been eating poorly because of a decreased appetite?: No Malnutrition Screening Tool Score: 0   Additional Information 1:1 In Past 12 Months?: No CIRT Risk: Yes Elopement Risk: No Does patient have medical clearance?: Yes     Disposition:  Disposition Disposition of Patient: Referred to (Dr. Shela Commons consulted with pt and recommended d/c from ER) Type of inpatient treatment program:  (pt d/c home to follow p with [facility],given referrals for [facility]) Patient referred to: [facility] V[facility] Mixer)  On Site Evaluation by:   Reviewed with Physician:     Bjorn Pippin 11/08/2011 9:11 PM

## 2011-11-08 NOTE — ED Notes (Signed)
MD at bedside. 

## 2011-11-08 NOTE — Consult Note (Signed)
Reason for Consult: Polysubstance abuse vs dependence: Opioid dependence and substance induced psychosis Referring Physician: Dr. Elray Buba James Walsh is an 26 y.o. male.  HPI: Patient was seen and chart reviewed. Patient has been stable over 24 hours without symptoms of behavioral or emotional problems. He has been cooperative with staff and taking his prescribed medication without side effects. He has no irritability, agitation, anger or aggressive behaviors. He denied suicidal or homicidal ideations. He was visited by his father and his girl friend today and he did not have negative emotions. He has plans of staying free from drugs and going back to work. He has no apparent delusions, paranoid and hallucinations. He has supportive family.   History reviewed. No pertinent past medical history.  History reviewed. No pertinent past surgical history.  No family history on file.  Social History:  does not have a smoking history on file. He does not have any smokeless tobacco history on file. He reports that he drinks about 1.2 ounces of alcohol per week. He reports that he uses illicit drugs.  Allergies:  Allergies  Allergen Reactions  . Other Other (See Comments)    Patient was ask if he has any allergies to medication or food. He stated that he was allergic to "everything" with no specific names given. It is unknown if he has allergies.    Medications: I have reviewed the patient's current medications.  No results found for this or any previous visit (from the past 48 hour(s)).  No results found.  Positive for anxiety, bad mood, bipolar, depression, illegal drug usage, sleep disturbance, tobacco use and anger out burst Blood pressure 126/79, pulse 87, temperature 98 F (36.7 C), temperature source Oral, resp. rate 18, SpO2 100.00%.   Assessment/Plan: Polysubstance abuse vs dependence Substance induced psychosis   Recommendation: Patient will be discharged to his family  and referred to out patient psychiatric care at [facility]. May provide Zyprexa 10 mg twice daily for controlling his substance induced paranoid delusions.   James Walsh,James R. 11/08/2011, 2:35 PM

## 2011-11-08 NOTE — ED Notes (Signed)
Pt's girlfriend visiting

## 2011-11-08 NOTE — ED Notes (Signed)
Started telespych process for re-evaluation since pt has been here for 5 days. Currently pending inpatient placement. Dr. Shela Commons saw pt yesterday, adjusted medications. Pt communicates better face to face vs telespych, advised to wait for Dr Shela Commons and cx Kelton Pillar per MD Jeraldine Loots

## 2011-11-08 NOTE — ED Provider Notes (Addendum)
Patient walking about.  No distress.  He had one episode of hitting a wall yesterday, but since that time has been cooperative.  Awaiting placement.  Gerhard Munch, MD 11/08/11 0847  3:34 PM Dr. Shela Commons recommends d/c w home Zyprexa 10mg  BID.  Gerhard Munch, MD 11/08/11 1535

## 2011-11-08 NOTE — ED Notes (Signed)
Pt's father visiting 

## 2012-11-02 ENCOUNTER — Emergency Department: Payer: Self-pay | Admitting: Internal Medicine

## 2012-11-02 LAB — CBC WITH DIFFERENTIAL/PLATELET
HCT: 41.8 % (ref 40.0–52.0)
HGB: 14.1 g/dL (ref 13.0–18.0)
Lymphocyte %: 8.3 %
MCH: 29.2 pg (ref 26.0–34.0)
MCHC: 33.8 g/dL (ref 32.0–36.0)
Monocyte %: 3.2 %
Neutrophil #: 10.8 10*3/uL — ABNORMAL HIGH (ref 1.4–6.5)
Platelet: 266 10*3/uL (ref 150–440)
RDW: 14.2 % (ref 11.5–14.5)
WBC: 12.3 10*3/uL — ABNORMAL HIGH (ref 3.8–10.6)

## 2012-11-07 LAB — CULTURE, BLOOD (SINGLE)

## 2013-05-06 ENCOUNTER — Emergency Department (HOSPITAL_COMMUNITY)
Admission: EM | Admit: 2013-05-06 | Discharge: 2013-05-07 | Disposition: A | Discharge status: Other Acute Inpatient Hospital | Payer: Self-pay | Attending: Emergency Medicine | Admitting: Emergency Medicine

## 2013-05-06 ENCOUNTER — Encounter (HOSPITAL_COMMUNITY): Payer: Self-pay | Admitting: Emergency Medicine

## 2013-05-06 DIAGNOSIS — F17200 Nicotine dependence, unspecified, uncomplicated: Secondary | ICD-10-CM | POA: Insufficient documentation

## 2013-05-06 DIAGNOSIS — F319 Bipolar disorder, unspecified: Secondary | ICD-10-CM | POA: Insufficient documentation

## 2013-05-06 DIAGNOSIS — Z046 Encounter for general psychiatric examination, requested by authority: Secondary | ICD-10-CM

## 2013-05-06 HISTORY — DX: Bipolar disorder, unspecified: F31.9

## 2013-05-06 LAB — SALICYLATE LEVEL

## 2013-05-06 LAB — COMPREHENSIVE METABOLIC PANEL
ALT: 12 U/L (ref 0–53)
AST: 12 U/L (ref 0–37)
Albumin: 4.2 g/dL (ref 3.5–5.2)
Alkaline Phosphatase: 64 U/L (ref 39–117)
BUN: 5 mg/dL — ABNORMAL LOW (ref 6–23)
CALCIUM: 9.8 mg/dL (ref 8.4–10.5)
CO2: 26 mEq/L (ref 19–32)
CREATININE: 0.79 mg/dL (ref 0.50–1.35)
Chloride: 103 mEq/L (ref 96–112)
GFR calc Af Amer: >90 mL/min (ref >90)
GFR calc non Af Amer: >90 mL/min (ref >90)
Glucose, Bld: 93 mg/dL (ref 70–99)
Potassium: 4.3 mEq/L (ref 3.7–5.3)
SODIUM: 141 meq/L (ref 137–147)
TOTAL PROTEIN: 7.1 g/dL (ref 6.0–8.3)
Total Bilirubin: 0.3 mg/dL (ref 0.3–1.2)

## 2013-05-06 LAB — ETHANOL: Alcohol, Ethyl (B): <11 mg/dL (ref 0–11)

## 2013-05-06 LAB — CBC
HCT: 39.2 % (ref 39.0–52.0)
Hemoglobin: 13.3 g/dL (ref 13.0–17.0)
MCH: 29.6 pg (ref 26.0–34.0)
MCHC: 33.9 g/dL (ref 30.0–36.0)
MCV: 87.3 fL (ref 78.0–100.0)
PLATELETS: 255 10*3/uL (ref 150–400)
RBC: 4.49 MIL/uL (ref 4.22–5.81)
RDW: 14.1 % (ref 11.5–15.5)
WBC: 10 10*3/uL (ref 4.0–10.5)

## 2013-05-06 LAB — RAPID URINE DRUG SCREEN, HOSP PERFORMED
AMPHETAMINES: NOT DETECTED
Barbiturates: NOT DETECTED
Benzodiazepines: NOT DETECTED
COCAINE: NOT DETECTED
OPIATES: POSITIVE — AB
TETRAHYDROCANNABINOL: POSITIVE — AB

## 2013-05-06 LAB — ACETAMINOPHEN LEVEL: Acetaminophen (Tylenol), Serum: <15 ug/mL (ref 10–30)

## 2013-05-06 MED ORDER — ONDANSETRON HCL 4 MG PO TABS: 4.0000 mg | ORAL_TABLET | Freq: Three times a day (TID) | ORAL | Status: DC | PRN

## 2013-05-06 MED ORDER — LORAZEPAM 1 MG PO TABS: 1.0000 mg | ORAL_TABLET | Freq: Three times a day (TID) | ORAL | Status: DC | PRN

## 2013-05-06 MED ORDER — ZOLPIDEM TARTRATE 5 MG PO TABS: 5.0000 mg | ORAL_TABLET | Freq: Every evening | ORAL | Status: DC | PRN

## 2013-05-06 MED ORDER — NICOTINE 21 MG/24HR TD PT24: 21.0000 mg | MEDICATED_PATCH | Freq: Every day | TRANSDERMAL | Status: DC

## 2013-05-06 MED ORDER — IBUPROFEN 200 MG PO TABS: 600.0000 mg | ORAL_TABLET | Freq: Three times a day (TID) | ORAL | Status: DC | PRN

## 2013-05-06 MED ORDER — ALUM & MAG HYDROXIDE-SIMETH 200-200-20 MG/5ML PO SUSP: 30.0000 mL | ORAL | Status: DC | PRN

## 2013-05-06 NOTE — ED Provider Notes (Signed)
Medical screening examination/treatment/procedure(s) were performed by non-physician practitioner and as supervising physician I was immediately available for consultation/collaboration.   EKG Interpretation None       Gilda Creasehristopher J. Pollina, MD 05/06/13 [phone]

## 2013-05-06 NOTE — ED Provider Notes (Signed)
Medical screening examination/treatment/procedure(s) were performed by non-physician practitioner and as supervising physician I was immediately available for consultation/collaboration.   EKG Interpretation None        Gilda Creasehristopher J. French Kendra, MD 05/06/13 2102

## 2013-05-06 NOTE — ED Notes (Signed)
Pt brought in by law enforcement and IVC'd. Pt hx of bi-polar disorder and substance abuse issues.  Per IVC paperwork, pt is making false accusation and irrational statements and thinks people are persecuting him. Pt states that he got into a verbal altercation with family and papers were placed on pt.  Pt is currently calm and cooperative.  Pt denies HI/SI/AVH.  Pt a/o x 4.  Skin warm and dry.

## 2013-05-06 NOTE — BH Assessment (Signed)
BHH Assessment Progress Note  At 22:19 I spoke to [facility] Hannah Muthersbaugh, PA-C in anticipation of TTS assessment.  Doylene Canninghomas Saliha Salts, MA Triage Specialist 05/06/2013 @ 22:20

## 2013-05-06 NOTE — BH Assessment (Signed)
Assessment Note  James Walsh is a 28 y.o. male.  He presents under IVC initiated by his mother, James Walsh [phone]) with whom pt lives along with his father.  The petition indicates that pt has a history of bipolar disorder, but is not taking medications.  It reports that pt has a history of suicidal threats, as well as addiction.  It notes that pt had a hearing for a 50B restraining order today, taken out by his girlfriend, who also has a history of heroin addiction along with the pt.  It reports that the pt's twin daughters, by this same girlfriend, are now in DSS custody.  It states that pt has been making false accusations about the parents, and that he believes that others are persecuting him.  Pt has reportedly been "ranting and raving," and throwing things.  Stressors: Pt acknowledges being in court today for the 50B hearing, but only after he was prompted.  He believes that this was based on false pretenses as a way for the girlfriend to obtain custody of the children.  He acknowledges living with his parents, but now reports plan and intent to move into a residence of his own.  When asked if this would be with others, he replies, "Just depends."  Lethality: Suicidality:  Pt denies SI currently or at any time in the past.  He denies any history of suicide attempts, or of self mutilation.  As noted, the petition reports a history of pt making suicidal threats, but offers no other details.  When asked about whether he is depressed, pt replies, "Only when I should."  He is mildly irritable during assessment, and is preoccupied with being released from the petition. Homicidality: Pt denies homicidal thoughts or physical aggression.  He acknowledges throwing the flower pot today, but denies throwing it at anyone.  He can think of no grounds for the 50B taken out by the girlfriend.  Pt denies having access to firearms.  Despite his irritability, pt is polite, calm and cooperative during  assessment.  ED staff denies any aggression during his stay in the ED. Psychosis: Pt denies any problems with hallucinations, and does not appear to be responding to internal stimuli.  He appears to be exhibiting persecutory delusions, as suggested by the petition.  He can think of no reason why the girlfriend would initiate a 50B, or why she would want sole custody of their daughters.  He even hopes for reconciliation with her.  However, he believes that his parents are "just playing mind games" with him, and that there may be collusion between them and the girlfriend.  This is despite the fact that he lives with the parents and has in the past identified them as his emergency contact.  Of the 50B hearing today, he reports, "Everybody up there was messing with my head."  When asked who else besides his girlfriend was doing this, he replies that there was no one else besides her. Substance Abuse: Pt denies any substance abuse problems, despite the assertions of the petition.  Moreover, his UDS is positive for opiates and THC.  Social History: As noted, pt has been living with his parents.  At one point he identifies them, an aunt, and many unspecified family members as his social supports.  He has a 9th grade education, and is currently unemployed.  He denies any history of abuse.  Treatment History: Pt denies any inpatient treatment history, but the EPIC record shows that he was admitted to  [facility] in 2003 and in 2007, and that he may have been referred to an outside facility in 2013.  Pt reports that he received outpatient treatment as a child, and that about 1 year ago he was taken to [facility] for evaluation.  However, he denies any treatment as an adult, and he denies that he is supposed to be on any psychotropic medications despite the assertion of the petition.  Axis I: Mood Disorder NOS 296.90 Axis II: Deferred 799.9 Axis III:  Past Medical History  Diagnosis Date  . Mental disorder   . Bipolar  disorder    Axis IV: educational problems, housing problems, occupational problems, problems related to legal system/crime, problems with primary support group and parent-child relational problems Axis V: GAF = 35  Past Medical History:  Past Medical History  Diagnosis Date  . Mental disorder   . Bipolar disorder     Past Surgical History  Procedure Laterality Date  . Fracture surgery      right elbow    Family History: No family history on file.  Social History:  reports that he has been smoking Cigarettes.  He has been smoking about 0.50 packs per day. He does not have any smokeless tobacco history on file. He reports that he drinks alcohol. He reports that he uses illicit drugs.  Additional Social History:  Alcohol / Drug Use Pain Medications: Pt denies, but petition reports Hx of substance abuse; see UDS. Prescriptions: Pt denies, but petition reports Hx of substance abuse Over the Counter: Pt denies, but petition reports Hx of substance abuse History of alcohol / drug use?:  (Unknown) Negative Consequences of Use: Personal relationships  CIWA: CIWA-Ar BP: 145/83 mmHg Pulse Rate: 107 COWS:    Allergies:  Allergies  Allergen Reactions  . Other Other (See Comments)    Patient was ask if he has any allergies to medication or food. He stated that he was allergic to "everything" with no specific names given. It is unknown if he has allergies.  Later pt endorses only seasonal allergies.    Home Medications:  (Not in a hospital admission)  OB/GYN Status:  No LMP for male patient.  General Assessment Data Location of Assessment: [facility] Is this a Tele or Face-to-Face Assessment?: Face-to-Face Is this an Initial Assessment or a Re-assessment for this encounter?: Initial Assessment Living Arrangements: Parent (Mother & Father) Can pt return to current living arrangement?: No (Plans to live in his own residence) Admission Status: Involuntary Is patient capable of signing  voluntary admission?: No Transfer from: Acute Hospital Referral Source: Other [place]([facility])     [facility] Living Arrangements: Parent (Mother & Father) Name of Psychiatrist: None Name of Therapist: None  Education Status Is patient currently in school?: No Highest grade of school patient has completed: 9 Contact person: Pt denies having any.  Risk to self Suicidal Ideation: No Suicidal Intent: No Is patient at risk for suicide?: No Suicidal Plan?: No Access to Means: No What has been your use of drugs/alcohol within the last 12 months?: Pt denies; petition reports polysubstance dependence; see UDS Previous Attempts/Gestures: No (None reported; pt denies) How many times?: 0 Other Self Harm Risks: Petition reports that pt has Hx of making suicidal threats. Triggers for Past Attempts: Unknown Intentional Self Injurious Behavior: None Family Suicide History: Yes (Uncle killed himself; "Everybody's crazy" in the family) Recent stressful life event(s): Other (Comment);Loss (Comment);Legal Issues (Girlfriend taking out 1750B & custody of their daughters.) Persecutory voices/beliefs?: Yes Depression:  No ("Only when I should.") Depression Symptoms: Insomnia;Feeling angry/irritable Substance abuse history and/or treatment for substance abuse?: Yes (Pt denies;Petition reports polysubstance dependence; see UDS) Suicide prevention information given to non-admitted patients: Yes  Risk to Others Homicidal Ideation: No Thoughts of Harm to Others: No Current Homicidal Intent: No Current Homicidal Plan: No Access to Homicidal Means: No Identified Victim: None History of harm to others?: Yes (Pt denies, but girlfriend took out 50B) Assessment of Violence: In past 6-12 months (Pt threw flower pot today; denies throwing it at anyone.) Violent Behavior Description: Irritable, but polite & cooperative during assessment. Does patient have access to weapons?:  (Denies having  firearms.) Criminal Charges Pending?: Yes Describe Pending Criminal Charges: 50B protective order Does patient have a court date: Yes Court Date:  (Court date was today, but there may be others pending.)  Psychosis Hallucinations: None noted Delusions: Persecutory (Believes parents, girlfriend are conspiring against him)  Mental Status Report Appear/Hygiene: Other (Comment) (Paper scrubs) Eye Contact: Poor Motor Activity: Unremarkable Speech: Other (Comment) (Unremarkable) Level of Consciousness: Alert Mood: Irritable Affect: Other (Comment) (Constricted) Anxiety Level: None Thought Processes: Coherent;Relevant Judgement: Impaired Orientation: Person;Place;Time;Situation Obsessive Compulsive Thoughts/Behaviors: None  Cognitive Functioning Concentration: Decreased Memory: Recent Intact;Remote Intact IQ: Average Insight: Poor Impulse Control: Fair Appetite: Fair (Variable) Weight Loss: 0 Weight Gain:  (Unspecified gain) Sleep: Decreased (Initial insomnia) Total Hours of Sleep:  (Unspecified) Vegetative Symptoms: None  ADLScreening [facility] Assessment Services) Patient's cognitive ability adequate to safely complete daily activities?: Yes Patient able to express need for assistance with ADLs?: Yes Independently performs ADLs?: Yes (appropriate for developmental age)  Prior Inpatient Therapy Prior Inpatient Therapy: Yes Prior Therapy Dates: Pt denies, but was admitted to [facility] in 2003 & 2007 Prior Therapy Facilty/Provider(s): Possibly other admissions  Prior Outpatient Therapy Prior Outpatient Therapy: Yes Prior Therapy Dates: Reports various providers in childhood, none as adult Prior Therapy Facilty/Provider(s): 2014: evaluated at [facility], per pt.  ADL Screening (condition at time of admission) Patient's cognitive ability adequate to safely complete daily activities?: Yes Is the patient deaf or have difficulty hearing?: No Does the patient have difficulty seeing, even  when wearing glasses/contacts?: No Does the patient have difficulty concentrating, remembering, or making decisions?: No Patient able to express need for assistance with ADLs?: Yes Does the patient have difficulty dressing or bathing?: No Independently performs ADLs?: Yes (appropriate for developmental age) Does the patient have difficulty walking or climbing stairs?: No Weakness of Legs: None Weakness of Arms/Hands: None  Home Assistive Devices/Equipment Home Assistive Devices/Equipment: None    Abuse/Neglect Assessment (Assessment to be complete while patient is alone) Physical Abuse: Denies Verbal Abuse: Denies Sexual Abuse: Denies Exploitation of patient/patient's resources: Denies Self-Neglect: Denies Values / Beliefs Cultural Requests During Hospitalization: None Spiritual Requests During Hospitalization: None   Advance Directives (For Healthcare) Advance Directive: Patient does not have advance directive;Patient would not like information Pre-existing out of facility DNR order (yellow form or pink MOST form): No Nutrition Screen- [facility] Adult/[facility] Patient's home diet: Regular  Additional Information 1:1 In Past 12 Months?: No CIRT Risk: Yes Elopement Risk: Yes Does patient have medical clearance?: Yes     Disposition:  Disposition Initial Assessment Completed for this Encounter: Yes Disposition of Patient: Inpatient treatment program Type of inpatient treatment program: Adult After consulting with Donell Sievert, PA it has been determined that pt presents a life threatening danger to himself and others, for which psychiatric hospitalization is indicated.  Pt accepted to [facility] to the service of Thedore Mins, MD,  Rm 400-2.  EDP Dahlia Client Muthersbaugh, PA-C concurs with opinion, and EDP Dr Oletta Cohn has already sustained the petition initiated by pt's mother.  Pt's nurse has been notified.  On Site Evaluation by:   Reviewed with Physician:  Donell Sievert, PA @ 22:53  Doylene Canning, MA Triage Specialist Raphael Gibney 05/06/2013 11:36 PM

## 2013-05-06 NOTE — ED Provider Notes (Addendum)
[name]: [id number]     Arrival date & time 05/06/13  1927 History   First MD Initiated Contact with Patient 05/06/13 1942     Chief Complaint  Patient presents with  . Medical Clearance     (Consider location/radiation/quality/duration/timing/severity/associated sxs/prior Treatment) The history is provided by the patient and medical records. No language interpreter was used.    James Walsh is a 28 y.o. male  with a hx of bipolar disorder with psychosis and hospitalization in 2013 presents to the Emergency Department complaining with pain [facility] Department and IVC paperwork.  Paperwork states that patient became angry this afternoon, throwing things and acting paranoid.  Paperwork also reports the patient is not taking his bipolar medication. On my exam patient is calm, cooperative and answering questions. He reports that he did indeed become angry and throw a flower pot. He adamantly denies suicidal or homicidal ideations, auditory or visual hallucinations. He reports that he is not supposed to be taking medication for his bipolar. He reports that he smokes one half pack of cigarettes per day and is an occasional drinker with his last alcohol earlier today. He reports he's had a total of 2 beers today. Patient denies all somatic symptoms.   Past Medical History  Diagnosis Date  . Mental disorder   . Bipolar disorder    Past Surgical History  Procedure Laterality Date  . Fracture surgery      right elbow   No family history on file. History  Substance Use Topics  . Smoking status: Current Every Day Smoker -- 0.50 packs/day    Types: Cigarettes  . Smokeless tobacco: Not on file  . Alcohol Use: 0.0 oz/week     Comment: occasional    Review of Systems  Constitutional: Negative for fever, diaphoresis, appetite change, fatigue and unexpected weight change.  HENT: Negative for mouth sores.   Eyes: Negative for visual disturbance.  Respiratory: Negative for  cough, chest tightness, shortness of breath and wheezing.   Cardiovascular: Negative for chest pain.  Gastrointestinal: Negative for nausea, vomiting, abdominal pain, diarrhea and constipation.  Endocrine: Negative for polydipsia, polyphagia and polyuria.  Genitourinary: Negative for dysuria, urgency, frequency and hematuria.  Musculoskeletal: Negative for back pain and neck stiffness.  Skin: Negative for rash.  Allergic/Immunologic: Negative for immunocompromised state.  Neurological: Negative for syncope, light-headedness and headaches.  Hematological: Does not bruise/bleed easily.  Psychiatric/Behavioral: Negative for sleep disturbance. The patient is not nervous/anxious.       Allergies  Other  Home Medications  No current outpatient prescriptions on file. BP 135/81  Pulse 89  Temp(Src) 97.7 F (36.5 C) (Oral)  Resp 18  SpO2 100% Physical Exam  Nursing note and vitals reviewed. Constitutional: He appears well-developed and well-nourished. No distress.  Awake, alert, nontoxic appearance  HENT:  Head: Normocephalic and atraumatic.  Mouth/Throat: Oropharynx is clear and moist. No oropharyngeal exudate.  Eyes: Conjunctivae are normal. No scleral icterus.  Neck: Normal range of motion. Neck supple.  Cardiovascular: Normal rate, regular rhythm, normal heart sounds and intact distal pulses.   No murmur heard. Pulmonary/Chest: Effort normal and breath sounds normal. No respiratory distress. He has no wheezes.  Abdominal: Soft. Bowel sounds are normal. He exhibits no mass. There is no tenderness. There is no rebound and no guarding.  Musculoskeletal: Normal range of motion. He exhibits no edema.  Neurological: He is alert.  Speech is clear and goal oriented Moves extremities without ataxia  Skin: Skin is warm and dry.  He is not diaphoretic.  Psychiatric: He has a normal mood and affect. His speech is normal and behavior is normal. Thought content normal. He is not actively  hallucinating. Cognition and memory are normal. He expresses no homicidal and no suicidal ideation. He expresses no suicidal plans and no homicidal plans.  Patient calm and cooperative    ED Course  Procedures (including critical care time) Labs Review Labs Reviewed  COMPREHENSIVE METABOLIC PANEL - Abnormal; Notable for the following:    BUN 5 (*)    All other components within normal limits  SALICYLATE LEVEL - Abnormal; Notable for the following:    Salicylate Lvl <2.0 (*)    All other components within normal limits  URINE RAPID DRUG SCREEN (HOSP PERFORMED) - Abnormal; Notable for the following:    Opiates POSITIVE (*)    Tetrahydrocannabinol POSITIVE (*)    All other components within normal limits  ACETAMINOPHEN LEVEL  CBC  ETHANOL   Imaging Review No results found.   EKG Interpretation None      MDM   Final diagnoses:  Bipolar 1 disorder  Involuntary commitment   James Walsh presents with IVC papers taken out by family. Hx of bipolar and not currently medicated.  Labs pending, but patient is otherwise medically cleared.    8:28 PM Pt discussed with Dr. Blinda LeatherwoodPollina who will complete first evaluation paperwork.    9:01 PM All labs are complete.  UDS positive for THC; no other lab abnormalities noted.  Pt is medically cleared.  Dierdre ForthHannah Kaetlin Bullen, PA-C 05/06/13 2101  11:39 PM Pt has been accepted to behavioral health to the service of [name].   Dahlia ClientHannah Lorianne Malbrough, PA-C 05/06/13 2340

## 2013-05-06 NOTE — ED Notes (Signed)
Mother Bud FaceJudy Yin called and spoke to Duke Energyashell RN  And reported. [facility], pt thinks he don'tt need help. He has twin girls that he hasn't seen in 10 days. Children mother OD twice so girls are now in custody of DSS. DSS wants pt to go through treatment so he can see his children.  They were in court today so his parents can get temporary custody. Pt got up and walked out. Mother reports pt acting out of character and has trust issues and paranoia. IVC to [facility] 5 times in 2013.  Mother requests to be called [phone] if discharge.  Patient will not sign consent for mother involvement. Mother reports last discharge in 2013 pt wandering in streets with no shoes on.

## 2013-05-06 NOTE — ED Notes (Signed)
James Walsh is a 28 y.o. male with a hx of bipolar disorder with psychosis and hospitalization in 2013 presents to the Emergency Department complaining with pain [name] transferred to psych hold for evaluation. IVC  Paperwork states that patient became angry this afternoon, throwing things and acting paranoid. Paperwork also reports the patient is not taking his bipolar medication. Patient  is anxious, cooperative and answering questions with Clinical research associatewriter. He admits to becoming angry. "They provoked me to get mad then took out papers. My parents are playing mind games with me.  I was couch when police came to get me." He denies SI/HI, A/V hall. He reports that he is not supposed to be taking medication for his bipolar. He reports that he smokes one half pack of cigarettes per day and is an occasional drinker with his last alcohol earlier today. He reports he's had a total of 2 beers today. Patient denies all statements on IVC papers.

## 2013-05-07 ENCOUNTER — Encounter (HOSPITAL_COMMUNITY): Payer: Self-pay | Admitting: Behavioral Health

## 2013-05-07 ENCOUNTER — Inpatient Hospital Stay (HOSPITAL_COMMUNITY)
Admission: EM | Admit: 2013-05-07 | Discharge: 2013-05-13 | DRG: 885 | Disposition: A | Discharge status: Home / Self Care | Payer: Federal, State, Local not specified - Other | Source: Intra-hospital | Attending: Psychiatry | Admitting: Psychiatry

## 2013-05-07 DIAGNOSIS — F1110 Opioid abuse, uncomplicated: Secondary | ICD-10-CM | POA: Diagnosis present

## 2013-05-07 DIAGNOSIS — F3110 Bipolar disorder, current episode manic without psychotic features, unspecified: Principal | ICD-10-CM | POA: Diagnosis present

## 2013-05-07 DIAGNOSIS — F1994 Other psychoactive substance use, unspecified with psychoactive substance-induced mood disorder: Secondary | ICD-10-CM | POA: Diagnosis present

## 2013-05-07 DIAGNOSIS — F1220 Cannabis dependence, uncomplicated: Secondary | ICD-10-CM | POA: Diagnosis present

## 2013-05-07 DIAGNOSIS — F319 Bipolar disorder, unspecified: Secondary | ICD-10-CM | POA: Diagnosis present

## 2013-05-07 DIAGNOSIS — F17200 Nicotine dependence, unspecified, uncomplicated: Secondary | ICD-10-CM | POA: Diagnosis present

## 2013-05-07 MED ORDER — HYDROXYZINE HCL 25 MG PO TABS
25.0000 mg | ORAL_TABLET | Freq: Four times a day (QID) | ORAL | Status: DC | PRN
  Administered 2013-05-07 – 2013-05-12 (×4): 25 mg via ORAL
  Filled 2013-05-07 (×4): qty 1

## 2013-05-07 MED ORDER — TRAZODONE HCL 50 MG PO TABS
50.0000 mg | ORAL_TABLET | Freq: Every evening | ORAL | Status: DC | PRN
  Administered 2013-05-07 – 2013-05-09 (×4): 50 mg via ORAL
  Filled 2013-05-07 (×10): qty 1

## 2013-05-07 MED ORDER — ACETAMINOPHEN 325 MG PO TABS
650.0000 mg | ORAL_TABLET | Freq: Four times a day (QID) | ORAL | Status: DC | PRN
  Administered 2013-05-07 – 2013-05-12 (×6): 650 mg via ORAL
  Filled 2013-05-07 (×7): qty 2

## 2013-05-07 MED ORDER — NICOTINE POLACRILEX 2 MG MT GUM
2.0000 mg | CHEWING_GUM | OROMUCOSAL | Status: DC | PRN
  Administered 2013-05-07: 2 mg via ORAL
  Filled 2013-05-07 (×2): qty 1

## 2013-05-07 MED ORDER — SALINE SPRAY 0.65 % NA SOLN
1.0000 | NASAL | Status: DC | PRN
  Administered 2013-05-07 – 2013-05-09 (×2): 1 via NASAL
  Filled 2013-05-07: qty 44

## 2013-05-07 MED ORDER — OLANZAPINE 10 MG PO TBDP
10.0000 mg | ORAL_TABLET | Freq: Three times a day (TID) | ORAL | Status: DC | PRN
Start: 2013-05-07 — End: 2013-05-13
  Administered 2013-05-11 – 2013-05-12 (×2): 10 mg via ORAL
  Filled 2013-05-07 (×2): qty 1

## 2013-05-07 MED ORDER — MAGNESIUM HYDROXIDE 400 MG/5ML PO SUSP: 30.0000 mL | Freq: Every day | ORAL | Status: DC | PRN

## 2013-05-07 MED ORDER — CLONIDINE HCL 0.1 MG PO TABS: 0.1000 mg | ORAL_TABLET | Freq: Four times a day (QID) | ORAL | Status: DC | PRN

## 2013-05-07 MED ORDER — ALUM & MAG HYDROXIDE-SIMETH 200-200-20 MG/5ML PO SUSP
30.0000 mL | ORAL | Status: DC | PRN
  Administered 2013-05-11: 30 mL via ORAL

## 2013-05-07 MED ORDER — LORATADINE 10 MG PO TABS
10.0000 mg | ORAL_TABLET | Freq: Every day | ORAL | Status: DC | PRN
  Administered 2013-05-07 – 2013-05-10 (×4): 10 mg via ORAL
  Filled 2013-05-07 (×5): qty 1

## 2013-05-07 MED ORDER — CARBAMAZEPINE ER 200 MG PO TB12
200.0000 mg | ORAL_TABLET | Freq: Two times a day (BID) | ORAL | Status: DC
  Administered 2013-05-08 – 2013-05-13 (×11): 200 mg via ORAL
  Filled 2013-05-07 (×15): qty 1

## 2013-05-07 NOTE — Progress Notes (Signed)
Adult Psychoeducational Group Note  Date:  05/07/2013 Time:  8:00 pm  Group Topic/Focus:  Wrap-Up Group:   The focus of this group is to help patients review their daily goal of treatment and discuss progress on daily workbooks.  Participation Level:  Active  Participation Quality:  Appropriate, Sharing and Supportive  Affect:  Appropriate  Cognitive:  Appropriate  Insight: Appropriate  Engagement in Group:  Engaged  Modes of Intervention:  Discussion, Education, Socialization and Support  Additional Comments:  Pt stated that his day has been all right. Pt stated that he has learned some things since he has been here. Pt did not want to share any detailed information.   Laural BenesJohnson, Hernandez [place] 05/07/2013, 8:46 PM

## 2013-05-07 NOTE — [facility] Group Notes (Signed)
[facility] Group Notes:  (Counselor/Nursing/MHT/Case Management/Adjunct)  05/07/2013 1:15PM  Type of Therapy:  Group Therapy  Participation Level:  Active  Participation Quality:  Appropriate  Affect:  Flat  Cognitive:  Oriented  Insight:  Improving  Engagement in Group:  Limited  Engagement in Therapy:  Limited  Modes of Intervention:  Discussion, Exploration and Socialization  Summary of Progress/Problems: The topic for group was balance in life.  Pt participated in the discussion about when their life was in balance and out of balance and how this feels.  Pt discussed ways to get back in balance and short term goals they can work on to get where they want to be. Wes stated he has balance in his life.  He denied any imbalance, and shut down when it was suggested that everyone is imbalanced from time to time.  The presenter challenged him on this, pointing out he was recently using heroin and is upset about not having custody of his children.  He reluctantly conceeded the point.  He stayed for half an hour, then left.   Daryel Geraldorth, Denetra Formoso B 05/07/2013 1:44 PM

## 2013-05-07 NOTE — [facility] Counselor (Signed)
Adult Comprehensive Assessment  Patient ID: James Walsh, male   DOB: [birth year], 28 y.o.   MRN: [id number]  Information Source:    Current Stressors:  Educational / Learning stressors: N/A Employment / Job issues: Yes, unemployed Family Relationships: Yes, conflictual relationship with parents  Surveyor, quantityinancial / Lack of resources (include bankruptcy): Yes, no income Housing / Lack of housing: Yes, pt was living at Lennar Corporationparent's house, but will move into his own trailer after d/c.   Physical health (include injuries & life threatening diseases): N/A Social relationships: Yes, DSS took away pt's 28 YO twins this year.   Substance abuse: Yes, pt endorse using ETOH, heroin, and percocet.  He reports IV herion use last week.  However, pt mininizes all drug use, stateing "I don't have a problem."   Bereavement / Loss: N/A  Living/Environment/Situation:  Living Arrangements: Parent Living conditions (as described by patient or guardian): Parent's house - "It was fine, until they decided to mess with my head."   How long has patient lived in current situation?: One year  What is atmosphere in current home: Comfortable;Chaotic  Family History:  Marital status: Single Does patient have children?: Yes How many children?: 2 How is patient's relationship with their children?: 1 YO twins - "I was seeing them everyday, pt reports ex-girlfriend left with them a couple of weeks ago."  Via nurse, twins are in the custody of DSS.    Childhood History:  By whom was/is the patient raised?: Both parents Description of patient's relationship with caregiver when they were a child: "It was good."   Patient's description of current relationship with people who raised him/her: "Weird, I have no idea.  They committed me here."  Does patient have siblings?: Yes Number of Siblings: 1 Description of patient's current relationship with siblings: 1 sister - "We're good."   Did patient suffer any  verbal/emotional/physical/sexual abuse as a child?: No Did patient suffer from severe childhood neglect?: No Has patient ever been sexually abused/assaulted/raped as an adolescent or adult?: No Was the patient ever a victim of a crime or a disaster?: No Witnessed domestic violence?: No Has patient been effected by domestic violence as an adult?: No  Education:  Currently a Consulting civil engineerstudent?: No Learning disability?: No  Employment/Work Situation:   Employment situation: Unemployed Patient's job has been impacted by current illness: No What is the longest time patient has a held a job?: 5 years  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  Has patient ever been in the [employer] and Companymilitary?: No Has patient ever served in Buyer, retailcombat?: No  Financial Resources:   Financial resources: No income Does patient have a Lawyerrepresentative payee or guardian?: No  Alcohol/Substance Abuse:   What has been your use of drugs/alcohol within the last 12 months?: See above If attempted suicide, did drugs/alcohol play a role in this?: No Alcohol/Substance Abuse Treatment Hx: Attends AA/NA;Past detox;Past Tx, Inpatient;Past Tx, Outpatient If yes, describe treatment: [facility].  Was very guarded about substance use.   Has alcohol/substance abuse ever caused legal problems?: Yes (Posession - 5 years ago)  Social Support System:   Patient's Community Support System: Good Describe Community Support System: Family  Type of faith/religion: None How does patient's faith help to cope with current illness?: None   Leisure/Recreation:   Leisure and Hobbies: Naval architectlaying golf  Strengths/Needs:   What things does the patient do well?: Baseball, hunting, and fishing In what areas does patient struggle /  problems for patient: "I don't have any issues."    Discharge Plan:   Does patient have access to transportation?: Yes Will patient be returning to same living situation after  discharge?: No Plan for living situation after discharge: Pt plans to live in his own trailer.   Currently receiving community mental health services: No If no, would patient like referral for services when discharged?: Yes (What county?) Medical sales representative ) Does patient have financial barriers related to discharge medications?: Yes Patient description of barriers related to discharge medications: No income   Summary/Recommendations:   Summary and Recommendations (to be completed by the evaluator): James Walsh is a 28 YO [place] male who presents as guarded, suspicious, and tearful.  He states "There is nothing wrong with me.  I'm normal.  Can I go home?"  Continues to mininize substance use.  Endorses using ETOH, heroin, and percocet, but states that it's "casual."  UDS results shows that pt is positive for opiates and THC.  Became very tearful when talking about his 1 YO twins, states that "my girlfriend took them away", but via chart reports that DSS took away twins from pt and girlfriend.  Is currently living with parents, but plans to move to his own trailer after d/c.  He can benefit from crisis stabilization, therapeutic milieu, medicaiton management, and referral for services.    James Walsh. 05/07/2013

## 2013-05-07 NOTE — Progress Notes (Signed)
Patient ID: Alda Learenton W Podgorski, male   DOB: [birth year], 28 y.o.   MRN: [id number]  28 year old [facility] Pt states that he is here because his dad owed him money and they got into an argument, so his parents involuntarily committed him. Previous notes for reason for admission say otherwise. Pt denies everything and doesn't feel that he needs to be here. He has been off his medications for years. He has a previous diagnosis of bipolar disorder. Pt states that he has taken zyprexa in the past but doesn't feel that he needs it anymore. Pt has no medical history. He denies SI/HI/AVH. He states that he only drinks occasionally and denies any drug use. UDS positive for opiates and THC. Per notes, pt has a history of heroin abuse. Pt educated on unit rules and plan. Pt verbalized understanding. Pt belongings searched and skin visually assessed by RN. Pt oriented to unit and room. No complaints of pain or discomfort at this time. Q15 min safety checks maintained. Will continue to monitor pt.

## 2013-05-07 NOTE — Tx Team (Signed)
Initial Interdisciplinary Treatment Plan  PATIENT STRENGTHS: (choose at least two) General fund of knowledge Physical Health Supportive family/friends  PATIENT STRESSORS: Legal issue Loss of daughters in DSS custody Marital or family conflict Medication change or noncompliance Substance abuse   PROBLEM LIST: Problem List/Patient Goals Date to be addressed Date deferred Reason deferred Estimated date of resolution  Psychosis 05/07/13                                                      DISCHARGE CRITERIA:  Improved stabilization in mood, thinking, and/or behavior Verbal commitment to aftercare and medication compliance  PRELIMINARY DISCHARGE PLAN: Outpatient therapy Return to previous living arrangement  PATIENT/FAMIILY INVOLVEMENT: This treatment plan has been presented to and reviewed with the patient, James Walsh, and/or family member.  The patient and family have been given the opportunity to ask questions and make suggestions.  James Walsh, James Walsh 05/07/2013, 1:47 AM

## 2013-05-07 NOTE — H&P (Signed)
Psychiatric Admission Assessment Adult  Patient Identification:  James Walsh Date of Evaluation:  05/07/2013 Chief Complaint: "I got angry over not getting custody of my children."  History of Present Illness::  James Walsh is a 28 year old male who presented to [facility] under IVC initiated by his mother. The patient has a history of Bipolar Disorder, but has not been taking medications. His admission was prompted by appearing in court for a 50 B hearing that was initiated by his girlfriend. Shamond felt that this was taken out as a way to prevent him for having custody of his children. He reportedly became angry and threw a flower pot at nobody in particular. Patient states today during his psychiatric assessment "My mother took out papers. We had an argument. It was over money. She did not have to do that. I went to court to get my kids. They are just playing games with me. My mother tells people that I have Bipolar. But I don't need medications because there is nothing wrong with me. I get angry sometimes. But I think that anyone would have reacted like that. It does not mean that they need medications. I have been using heroine and marijuana. That's no big deal. Somebody told me that a little bit of drugs in my system would not prevent me from getting my kids. I really just want to get out of here. I don't need to be here." The patient shows very poor insight into his mental health problems. He has been previously diagnosed with Bipolar Disorder at [facility] in the past but does not believe that he has any mental illness. Patient appears to be greatly minimizing his symptoms. He is fixated on being discharged from the hospital.   Elements:  Location:  Mood lability . Quality:  Anger, aggression, family conflict . Severity:  Severe . Timing:  last few days . Duration:  History of mental health problems . Context:  Drug abuse, family problems, poor coping skills . Associated  Signs/Synptoms: Depression Symptoms:  depressed mood, anhedonia, hopelessness, anxiety, (Hypo) Manic Symptoms:  Delusions, Distractibility, Elevated Mood, Impulsivity, Irritable Mood, Labiality of Mood, Anxiety Symptoms:  Excessive Worry, Psychotic Symptoms:  Denies PTSD Symptoms: Denies Total Time spent with patient: 1 hour  Psychiatric Specialty Exam: Physical Exam  Constitutional:  Physical exam findings reviewed from [facility] and I concur with no noted exceptions.   Psychiatric: His mood appears anxious. His speech is rapid and/or pressured. He is agitated and aggressive. He expresses impulsivity.    Review of Systems  Constitutional: Negative.   HENT: Negative.   Eyes: Negative.   Respiratory: Negative.   Cardiovascular: Negative.   Gastrointestinal: Negative.   Genitourinary: Negative.   Musculoskeletal: Negative.   Skin: Negative.   Neurological: Negative.   Endo/Heme/Allergies: Negative.   Psychiatric/Behavioral: Positive for depression and substance abuse. Negative for suicidal ideas, hallucinations and memory loss. The patient is nervous/anxious and has insomnia.     Blood pressure 135/85, pulse 125, temperature 97.4 F (36.3 C), temperature source Oral, resp. rate 20, height 5' 7.72" (1.72 m), weight 73.029 kg (161 lb).Body mass index is 24.69 kg/(m^2).  General Appearance: Disheveled  Eye Contact::  Good  Speech:  Clear and Coherent  Volume:  Increased  Mood:  Angry and Irritable  Affect:  Labile and Full Range  Thought Process:  Circumstantial  Orientation:  Full (Time, Place, and Person)  Thought Content:  Delusions  Suicidal Thoughts:  No  Homicidal Thoughts:  No  Memory:  Immediate;   Fair Recent;   Fair Remote;   Fair  Judgement:  Poor  Insight:  Lacking  Psychomotor Activity:  Increased  Concentration:  Fair  Recall:  [employer] of Knowledge:Fair  Language: Fair  Akathisia:  No  Handed:  Right  AIMS (if indicated):     Assets:   Communication Skills Desire for Improvement Physical Health  Sleep:  Number of Hours: 2.5    Musculoskeletal: Strength & Muscle Tone: within normal limits Gait & Station: normal Patient leans: N/A  Past Psychiatric History:Yes Diagnosis: Bipolar Disorder   Hospitalizations: [facility] 2007  Outpatient Care:None currently   Substance Abuse Care:Denies  Self-Mutilation:Denies  Suicidal Attempts:Denies but has history of suicidal gestures  Violent Behaviors:Potential    Past Medical History:   Past Medical History  Diagnosis Date  . Mental disorder   . Bipolar disorder    None. Allergies:   Allergies  Allergen Reactions  . Other Other (See Comments)    Patient was ask if he has any allergies to medication or food. He stated that he was allergic to "everything" with no specific names given. It is unknown if he has allergies.  Later pt endorses only seasonal allergies.   PTA Medications: No prescriptions prior to admission    Previous Psychotropic Medications:  Medication/Dose  Zyprexa                Substance Abuse History in the last 12 months:  yes  Consequences of Substance Abuse: Patient has been smoking marijuana with worsening of mental health symptoms.  He also admits to using heroine.   Social History:  reports that he has been smoking Cigarettes.  He has been smoking about 0.50 packs per day. He does not have any smokeless tobacco history on file. He reports that he drinks alcohol. He reports that he uses illicit drugs (Marijuana). Additional Social History:                      Current Place of Residence:   Place of Birth:   Family Members: Marital Status:  Single Children:2  Sons:  Daughters:2 Relationships: Education:  "I went through the ninth grade."  Educational Problems/Performance: Religious Beliefs/Practices: History of Abuse (Emotional/Phsycial/Sexual) Occupational Experiences; Military History:  None. Legal  History: Hobbies/Interests:  Family History:  History reviewed. No pertinent family history. Patient reports that his father and sister have both been diagnosed with Bipolar Disorder.   Results for orders placed during the hospital encounter of 05/06/13 (from the past 72 hour(s))  URINE RAPID DRUG SCREEN (HOSP PERFORMED)     Status: Abnormal   Collection Time    05/06/13  7:59 PM      Result Value Ref Range   Opiates POSITIVE (*) NONE DETECTED   Cocaine NONE DETECTED  NONE DETECTED   Benzodiazepines NONE DETECTED  NONE DETECTED   Amphetamines NONE DETECTED  NONE DETECTED   Tetrahydrocannabinol POSITIVE (*) NONE DETECTED   Barbiturates NONE DETECTED  NONE DETECTED   Comment:            DRUG SCREEN FOR MEDICAL PURPOSES     ONLY.  IF CONFIRMATION IS NEEDED     FOR ANY PURPOSE, NOTIFY LAB     WITHIN 5 DAYS.                LOWEST DETECTABLE LIMITS     FOR URINE DRUG SCREEN     Drug Class       Cutoff (ng/mL)  Amphetamine      1000     Barbiturate      200     Benzodiazepine   188     Tricyclics       416     Opiates          300     Cocaine          300     THC              50  ACETAMINOPHEN LEVEL     Status: None   Collection Time    05/06/13  8:00 PM      Result Value Ref Range   Acetaminophen (Tylenol), Serum <15.0  10 - 30 ug/mL   Comment:            THERAPEUTIC CONCENTRATIONS VARY     SIGNIFICANTLY. A RANGE OF 10-30     ug/mL MAY BE AN EFFECTIVE     CONCENTRATION FOR MANY PATIENTS.     HOWEVER, SOME ARE BEST TREATED     AT CONCENTRATIONS OUTSIDE THIS     RANGE.     ACETAMINOPHEN CONCENTRATIONS     >150 ug/mL AT 4 HOURS AFTER     INGESTION AND >50 ug/mL AT 12     HOURS AFTER INGESTION ARE     OFTEN ASSOCIATED WITH TOXIC     REACTIONS.  CBC     Status: None   Collection Time    05/06/13  8:00 PM      Result Value Ref Range   WBC 10.0  4.0 - 10.5 K/uL   RBC 4.49  4.22 - 5.81 MIL/uL   Hemoglobin 13.3  13.0 - 17.0 g/dL   HCT 39.2  39.0 - 52.0 %   MCV 87.3   78.0 - 100.0 fL   MCH 29.6  26.0 - 34.0 pg   MCHC 33.9  30.0 - 36.0 g/dL   RDW 14.1  11.5 - 15.5 %   Platelets 255  150 - 400 K/uL  COMPREHENSIVE METABOLIC PANEL     Status: Abnormal   Collection Time    05/06/13  8:00 PM      Result Value Ref Range   Sodium 141  137 - 147 mEq/L   Potassium 4.3  3.7 - 5.3 mEq/L   Chloride 103  96 - 112 mEq/L   CO2 26  19 - 32 mEq/L   Glucose, Bld 93  70 - 99 mg/dL   BUN 5 (*) 6 - 23 mg/dL   Creatinine, Ser 0.79  0.50 - 1.35 mg/dL   Calcium 9.8  8.4 - 10.5 mg/dL   Total Protein 7.1  6.0 - 8.3 g/dL   Albumin 4.2  3.5 - 5.2 g/dL   AST 12  0 - 37 U/L   ALT 12  0 - 53 U/L   Alkaline Phosphatase 64  39 - 117 U/L   Total Bilirubin 0.3  0.3 - 1.2 mg/dL   GFR calc non Af Amer >90  >90 mL/min   GFR calc Af Amer >90  >90 mL/min   Comment: (NOTE)     The eGFR has been calculated using the CKD EPI equation.     This calculation has not been validated in all clinical situations.     eGFR's persistently <90 mL/min signify possible Chronic Kidney     Disease.  ETHANOL     Status: None   Collection Time    05/06/13  8:00 PM  Result Value Ref Range   Alcohol, Ethyl (B) <11  0 - 11 mg/dL   Comment:            LOWEST DETECTABLE LIMIT FOR     SERUM ALCOHOL IS 11 mg/dL     FOR MEDICAL PURPOSES ONLY  SALICYLATE LEVEL     Status: Abnormal   Collection Time    05/06/13  8:00 PM      Result Value Ref Range   Salicylate Lvl <6.7 (*) 2.8 - 20.0 mg/dL   Psychological Evaluations:  Assessment:   DSM5:  AXIS I:  Bipolar disorder, unspecified              Substance induced mood disorder              Opiate abuse, continuous               Cannabis dependence  AXIS II:  Deferred AXIS III:   Past Medical History  Diagnosis Date  . Mental disorder   . Bipolar disorder    AXIS IV:  economic problems, educational problems, housing problems, occupational problems, other psychosocial or environmental problems, problems related to legal system/crime, problems  related to social environment and problems with primary support group AXIS V:  41-50 serious symptoms   Treatment Plan/Recommendations:   1. Admit for crisis management and stabilization. Estimated length of stay 5-7 days. 2. Medication management to reduce current symptoms to base line and improve the patient's level of functioning. Trazodone initiated to help improve sleep. 3. Develop treatment plan to decrease risk of relapse upon discharge of depressive symptoms and the need for readmission. 5. Group therapy to facilitate development of healthy coping skills to use for mood lability.  6. Health care follow up as needed for medical problems.  7. Discharge plan to include therapy to help patient cope with stressors.  8. Call for Consult with Hospitalist for additional specialty patient services as needed.   Treatment Plan Summary: Daily contact with patient to assess and evaluate symptoms and progress in treatment Medication management Current Medications:  Current Facility-Administered Medications  Medication Dose Route Frequency Provider Last Rate Last Dose  . acetaminophen (TYLENOL) tablet 650 mg  650 mg Oral Q6H PRN Laverle Hobby, PA-C   650 mg at 05/07/13 3419  . alum & mag hydroxide-simeth (MAALOX/MYLANTA) 200-200-20 MG/5ML suspension 30 mL  30 mL Oral Q4H PRN Laverle Hobby, PA-C      . carbamazepine (TEGRETOL XR) 12 hr tablet 200 mg  200 mg Oral BID Makih Stefanko      . hydrOXYzine (ATARAX/VISTARIL) tablet 25 mg  25 mg Oral Q6H PRN Laverle Hobby, PA-C      . loratadine (CLARITIN) tablet 10 mg  10 mg Oral Daily PRN Laverle Hobby, PA-C   10 mg at 05/07/13 0541  . magnesium hydroxide (MILK OF MAGNESIA) suspension 30 mL  30 mL Oral Daily PRN Laverle Hobby, PA-C      . OLANZapine zydis (ZYPREXA) disintegrating tablet 10 mg  10 mg Oral Q8H PRN Tripp Goins      . traZODone (DESYREL) tablet 50 mg  50 mg Oral QHS,MR X 1 Laverle Hobby, PA-C        Observation  Level/Precautions:  15 minute checks  Laboratory:  CBC Chemistry Profile UDS  Psychotherapy: Individual and Group Therapy   Medications:  Tegretol XR 200 mg BID for improved mood stability, Zyprexa Zydis 10 mg every eight hours as needed for agitation  Consultations:  As needed  Discharge Concerns:  Safety and Stability   Estimated LOS: 5-7 days   Other:     I certify that inpatient services furnished can reasonably be expected to improve the patient's condition.   Elmarie Shiley NP-C 4/2/201512:08 PM  Patient seen, evaluated and I agree with notes by Nurse Practitioner. Corena Pilgrim, MD

## 2013-05-07 NOTE — Progress Notes (Signed)
D: Patient in the hallway pacing on approach.  Patient has an angry affect.  Patient states, "I am ready to go home and I want to talk to the doctor."  Writer spoke with patient and patient fine with not being able to go home at this time.  Patient states, "I feel fine, I'm not going to take any medication so I might as well just go home.  Patient did later tell writer his main reason for wanting to leaves was because he wanted a cigarette.  Patient appears to laugh inappropriately.  Patient also denies drug use.  Patient states opiates were in his system because he had a prescription (no pta meds on admission per admission assessment).  Patient states he is not going to go through withdrawal.  Patient denies SI/HI and denies. A: Staff to monitor Q 15 mins for safety.  Encouragement and support offered.  Patient refused Tegretol tonight.  Tylenol administered prn for back pain.  Vistaril administered prn for anxiety.  Saline nasal spray administered prn for congestion. R: Patient remains safe on the unit.  Patient attended group tonight.  Patient visible on the unit and has minimal interaction with peers.  Patient taking administered medications.

## 2013-05-07 NOTE — ED Notes (Signed)
Pt  [facility],   Donell SievertSpencer Simon, PA has been determined that pt presents a life threatening danger to himself and others, for which psychiatric hospitalization is indicated.  [facility] to the service of Thedore MinsMojeed [name], MD, Rm 400-2.  EDP Dahlia ClientHannah Muthersbaugh, PA-C concurs with opinion, and EDP Dr Oletta CohnPolina has already sustained the petition initiated by pt's mother.  [facility]. VSS Pt continues to deny SI/HI.

## 2013-05-07 NOTE — Progress Notes (Addendum)
Patient ID: James Walsh, male   DOB: [birth year], 28 y.o.   MRN: [id number] D: Patient met with treatment team today and discussed why he came in.  Patient is minimizing his drug use stating, "I smoke a little weed."  He also admitted to heroin use only after being asked.  Patient states he went to court for a custody hearing when it actually was for a 50B.  Patient states he wants to get his daughters back.  He states the girlfriend has the children when a previous documentation states DSS has custody of them.  Patient would inappropriate smile and laugh throughout the meeting.  He denies symptoms stating, "there is nothing wrong with me.  I want to go home."  Patient denies SI/HI/AVH.  A: continue to monitor medication management and MD orders.  Safety checks completed every 15 minutes per protocol.  R: patient is receptive to staff; her behavior is appropriate.

## 2013-05-07 NOTE — [facility] Suicide Risk Assessment (Signed)
   Nursing information obtained from:  Patient Demographic factors:  Male;Caucasian;Unemployed Current Mental Status:  NA Loss Factors:  Loss of significant relationship;Legal issues Historical Factors:  NA Risk Reduction Factors:  Living with another person, especially a relative;Responsible for children under 28 years of age Total Time spent with patient: 20 minutes  CLINICAL FACTORS:   Severe Anxiety and/or Agitation Bipolar Disorder: Manic Alcohol/Substance Abuse/Dependencies  Psychiatric Specialty Exam: Physical Exam  Psychiatric: Thought content normal. His affect is labile. His speech is rapid and/or pressured. He is agitated and aggressive. Cognition and memory are normal. He expresses impulsivity.    Review of Systems  Constitutional: Negative.   HENT: Negative.   Eyes: Negative.   Respiratory: Negative.   Cardiovascular: Negative.   Gastrointestinal: Negative.   Genitourinary: Negative.   Musculoskeletal: Negative.   Skin: Negative.   Neurological: Negative.   Endo/Heme/Allergies: Negative.   Psychiatric/Behavioral: Positive for substance abuse. The patient is nervous/anxious and has insomnia.     Blood pressure 135/85, pulse 125, temperature 97.4 F (36.3 C), temperature source Oral, resp. rate 20, height 5' 7.72" (1.72 m), weight 73.029 kg (161 lb).Body mass index is 24.69 kg/(m^2).  General Appearance: Disheveled  Eye Contact::  Good  Speech:  Clear and Coherent  Volume:  Increased  Mood:  Angry and Irritable  Affect:  Labile and Full Range  Thought Process:  Circumstantial  Orientation:  Full (Time, Place, and Person)  Thought Content:  Delusions  Suicidal Thoughts:  No  Homicidal Thoughts:  No  Memory:  Immediate;   Fair Recent;   Fair Remote;   Fair  Judgement:  Poor  Insight:  Lacking  Psychomotor Activity:  Increased  Concentration:  Fair  Recall:  [employer]  Fund of Knowledge:Fair  Language: Fair  Akathisia:  No  Handed:  Right  AIMS (if  indicated):     Assets:  Communication Skills Desire for Improvement Physical Health  Sleep:  Number of Hours: 2.5   Musculoskeletal: Strength & Muscle Tone: within normal limits Gait & Station: normal Patient leans: N/A  COGNITIVE FEATURES THAT CONTRIBUTE TO RISK:  Closed-mindedness Polarized thinking    SUICIDE RISK:   Minimal: No identifiable suicidal ideation.  Patients presenting with no risk factors but with morbid ruminations; may be classified as minimal risk based on the severity of the depressive symptoms  PLAN OF CARE:1. Admit for crisis management and stabilization. 2. Medication management to reduce current symptoms to base line and improve the     patient's overall level of functioning 3. Treat health problems as indicated. 4. Develop treatment plan to decrease risk of relapse upon discharge and the need for     readmission. 5. Psycho-social education regarding relapse prevention and self care. 6. Health care follow up as needed for medical problems. 7. Restart home medications where appropriate.   I certify that inpatient services furnished can reasonably be expected to improve the patient's condition.  Thedore MinsAkintayo, Marsa Matteo, MD 05/07/2013, 11:12 AM

## 2013-05-07 NOTE — Tx Team (Signed)
  Interdisciplinary Treatment Plan Update   Date Reviewed:  05/07/2013  Time Reviewed:  11:11 AM  Progress in Treatment:   Attending groups: Yes Participating in groups: Yes Taking medication as prescribed: None prescribed yet  Tolerating medication: unknown Family/Significant other contact made: Yes  Patient understands diagnosis: No  He is asking for nothing.  States he does not need to be here  Discussing patient identified problems/goals with staff: Yes See initial care plan Medical problems stabilized or resolved: Yes Denies suicidal/homicidal ideation: Yes In tx team Patient has not harmed self or others: Yes  For review of initial/current patient goals, please see plan of care.  Estimated Length of Stay:  4-5 days  Reason for Continuation of Hospitalization: Mania Medication stabilization Withdrawal symptoms  New Problems/Goals identified:  N/A  Discharge Plan or Barriers:   return home, follow up outpt  Additional Comments:  James Walsh is a 28 y.o. male. He presents under IVC initiated by his mother, Bud FaceJudy Privett [phone]) with whom pt lives along with his father. The petition indicates that pt has a history of bipolar disorder, but is not taking medications. It reports that pt has a history of suicidal threats, as well as addiction. It notes that pt had a hearing for a 50B restraining order today, taken out by his girlfriend, who also has a history of heroin addiction along with the pt. It reports that the pt's twin daughters, by this same girlfriend, are now in DSS custody. It states that pt has been making false accusations about the parents, and that he believes that others are persecuting him. Pt has reportedly been "ranting and raving," and throwing things.   Attendees:  Signature: Thedore MinsMojeed [name], MD 05/07/2013 11:11 AM   Signature: Richelle Itood Durell Lofaso, LCSW 05/07/2013 11:11 AM  Signature: Fransisca KaufmannLaura Davis, NP 05/07/2013 11:11 AM  Signature: Joslyn Devonaroline Beaudry, RN 05/07/2013  11:11 AM  Signature: Liborio NixonPatrice White, RN 05/07/2013 11:11 AM  Signature:  05/07/2013 11:11 AM  Signature:   05/07/2013 11:11 AM  Signature:    Signature:    Signature:    Signature:    Signature:    Signature:      Scribe for Treatment Team:   Richelle Itood Ciclaly Mulcahey, LCSW  05/07/2013 11:11 AM

## 2013-05-08 DIAGNOSIS — F1110 Opioid abuse, uncomplicated: Secondary | ICD-10-CM

## 2013-05-08 MED ORDER — NICOTINE 21 MG/24HR TD PT24
21.0000 mg | MEDICATED_PATCH | Freq: Every day | TRANSDERMAL | Status: DC
  Administered 2013-05-08: 21 mg via TRANSDERMAL
  Filled 2013-05-08 (×7): qty 1

## 2013-05-08 NOTE — Progress Notes (Signed)
[facility] MD Progress Note  05/08/2013 11:32 AM James Walsh  MRN:  [id number] Subjective: " I just want to go home, being here makes me upset and angry.'' Objective: Patient seen and chart is reviewed. Patient has no insight into his mental illness as evidence by refusing to take medications, he prefers illicit drug of abuse than the regular medications. His urine toxicology is positive for opiates and Cannabis. He has a long history of bipolar disorder and polysubstance abuse dating back to age 89. He remains easily agitated, labile, defiant, argumentative and oppositional. 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 Mental disorder   . Bipolar disorder    Axis IV: economic problems, other psychosocial or environmental problems, problems related to social environment and problems with primary support group  ADL's:  Intact  Sleep: Fair  Appetite:  Fair  Suicidal Ideation:  denies Homicidal Ideation:  denies AEB (as evidenced by):  Psychiatric Specialty Exam: Physical Exam  Psychiatric: His speech is normal. His affect is angry and labile. He is agitated and aggressive. Thought content is paranoid. Cognition and memory are normal. He expresses impulsivity.    Review of Systems  Constitutional: Negative.   HENT: Negative.   Eyes: Negative.   Respiratory: Negative.    Cardiovascular: Negative.   Gastrointestinal: Negative.   Genitourinary: Negative.   Musculoskeletal: Negative.   Skin: Negative.   Neurological: Negative.   Endo/Heme/Allergies: Negative.   Psychiatric/Behavioral: Positive for substance abuse. The patient is nervous/anxious.     Blood pressure 116/82, pulse 120, temperature 98.1 F (36.7 C), temperature source Oral, resp. rate 16, height 5' 7.72" (1.72 m), weight 73.029 kg (161 lb).Body mass index is 24.69 kg/(m^2).  General Appearance: Fairly Groomed  Engineer, water::  Minimal  Speech:  Pressured  Volume:  Increased  Mood:  Angry and Irritable  Affect:  Labile and Full Range  Thought Process:  Goal Directed  Orientation:  Full (Time, Place, and Person)  Thought Content:  Negative  Suicidal Thoughts:  No  Homicidal Thoughts:  No  Memory:  Immediate;   Fair Recent;   Fair Remote;   Fair  Judgement:  Poor  Insight:  Lacking  Psychomotor Activity:  Increased  Concentration:  Fair  Recall:  [employer] of Knowledge:Fair  Language: Good  Akathisia:  No  Handed:  Right  AIMS (if indicated):     Assets:  Communication Skills Physical Health  Sleep:  Number of Hours: 5.25   Musculoskeletal: Strength & Muscle Tone: within normal limits Gait & Station: normal Patient leans: N/A  Current Medications: Current Facility-Administered Medications  Medication Dose Route Frequency Provider Last Rate Last Dose  . acetaminophen (TYLENOL) tablet 650 mg  650 mg Oral Q6H PRN Laverle Hobby, PA-C   650 mg at 05/07/13 2148  .  alum & mag hydroxide-simeth (MAALOX/MYLANTA) 200-200-20 MG/5ML suspension 30 mL  30 mL Oral Q4H PRN Laverle Hobby, PA-C      . carbamazepine (TEGRETOL XR) 12 hr tablet 200 mg  200 mg Oral BID Tamarion Haymond   200 mg at 05/08/13 1010  . cloNIDine (CATAPRES) tablet 0.1 mg  0.1 mg Oral Q6H PRN Elmarie Shiley, NP      . hydrOXYzine (ATARAX/VISTARIL) tablet 25 mg  25 mg Oral Q6H PRN Laverle Hobby, PA-C   25 mg at 05/07/13  2148  . loratadine (CLARITIN) tablet 10 mg  10 mg Oral Daily PRN Laverle Hobby, PA-C   10 mg at 05/07/13 0541  . magnesium hydroxide (MILK OF MAGNESIA) suspension 30 mL  30 mL Oral Daily PRN Laverle Hobby, PA-C      . nicotine polacrilex (NICORETTE) gum 2 mg  2 mg Oral PRN Treyshaun Keatts   2 mg at 05/07/13 1933  . OLANZapine zydis (ZYPREXA) disintegrating tablet 10 mg  10 mg Oral Q8H PRN Jerilynn Feldmeier      . sodium chloride (OCEAN) 0.65 % nasal spray 1 spray  1 spray Each Nare PRN Lurena Nida, NP   1 spray at 05/07/13 2210  . traZODone (DESYREL) tablet 50 mg  50 mg Oral QHS,MR X 1 Laverle Hobby, PA-C   50 mg at 05/07/13 2148    Lab Results:  Results for orders placed during the hospital encounter of 05/06/13 (from the past 48 hour(s))  URINE RAPID DRUG SCREEN (HOSP PERFORMED)     Status: Abnormal   Collection Time    05/06/13  7:59 PM      Result Value Ref Range   Opiates POSITIVE (*) NONE DETECTED   Cocaine NONE DETECTED  NONE DETECTED   Benzodiazepines NONE DETECTED  NONE DETECTED   Amphetamines NONE DETECTED  NONE DETECTED   Tetrahydrocannabinol POSITIVE (*) NONE DETECTED   Barbiturates NONE DETECTED  NONE DETECTED   Comment:            DRUG SCREEN FOR MEDICAL PURPOSES     ONLY.  IF CONFIRMATION IS NEEDED     FOR ANY PURPOSE, NOTIFY LAB     WITHIN 5 DAYS.                LOWEST DETECTABLE LIMITS     FOR URINE DRUG SCREEN     Drug Class       Cutoff (ng/mL)     Amphetamine      1000     Barbiturate      200     Benzodiazepine   361     Tricyclics       443     Opiates          300     Cocaine          300     THC              50  ACETAMINOPHEN LEVEL     Status: None   Collection Time    05/06/13  8:00 PM      Result Value Ref Range   Acetaminophen (Tylenol), Serum <15.0  10 - 30 ug/mL   Comment:            THERAPEUTIC CONCENTRATIONS VARY     SIGNIFICANTLY. A RANGE OF 10-30     ug/mL MAY BE AN EFFECTIVE     CONCENTRATION FOR MANY PATIENTS.  HOWEVER, SOME  ARE BEST TREATED     AT CONCENTRATIONS OUTSIDE THIS     RANGE.     ACETAMINOPHEN CONCENTRATIONS     >150 ug/mL AT 4 HOURS AFTER     INGESTION AND >50 ug/mL AT 12     HOURS AFTER INGESTION ARE     OFTEN ASSOCIATED WITH TOXIC     REACTIONS.  CBC     Status: None   Collection Time    05/06/13  8:00 PM      Result Value Ref Range   WBC 10.0  4.0 - 10.5 K/uL   RBC 4.49  4.22 - 5.81 MIL/uL   Hemoglobin 13.3  13.0 - 17.0 g/dL   HCT 39.2  39.0 - 52.0 %   MCV 87.3  78.0 - 100.0 fL   MCH 29.6  26.0 - 34.0 pg   MCHC 33.9  30.0 - 36.0 g/dL   RDW 14.1  11.5 - 15.5 %   Platelets 255  150 - 400 K/uL  COMPREHENSIVE METABOLIC PANEL     Status: Abnormal   Collection Time    05/06/13  8:00 PM      Result Value Ref Range   Sodium 141  137 - 147 mEq/L   Potassium 4.3  3.7 - 5.3 mEq/L   Chloride 103  96 - 112 mEq/L   CO2 26  19 - 32 mEq/L   Glucose, Bld 93  70 - 99 mg/dL   BUN 5 (*) 6 - 23 mg/dL   Creatinine, Ser 0.79  0.50 - 1.35 mg/dL   Calcium 9.8  8.4 - 10.5 mg/dL   Total Protein 7.1  6.0 - 8.3 g/dL   Albumin 4.2  3.5 - 5.2 g/dL   AST 12  0 - 37 U/L   ALT 12  0 - 53 U/L   Alkaline Phosphatase 64  39 - 117 U/L   Total Bilirubin 0.3  0.3 - 1.2 mg/dL   GFR calc non Af Amer >90  >90 mL/min   GFR calc Af Amer >90  >90 mL/min   Comment: (NOTE)     The eGFR has been calculated using the CKD EPI equation.     This calculation has not been validated in all clinical situations.     eGFR's persistently <90 mL/min signify possible Chronic Kidney     Disease.  ETHANOL     Status: None   Collection Time    05/06/13  8:00 PM      Result Value Ref Range   Alcohol, Ethyl (B) <11  0 - 11 mg/dL   Comment:            LOWEST DETECTABLE LIMIT FOR     SERUM ALCOHOL IS 11 mg/dL     FOR MEDICAL PURPOSES ONLY  SALICYLATE LEVEL     Status: Abnormal   Collection Time    05/06/13  8:00 PM      Result Value Ref Range   Salicylate Lvl <1.4 (*) 2.8 - 20.0 mg/dL    Physical Findings: AIMS:  , ,  ,  ,     CIWA:  CIWA-Ar Total: 0 COWS:  COWS Total Score: 0  Treatment Plan Summary: Daily contact with patient to assess and evaluate symptoms and progress in treatment Medication management  Plan:1. Admit for crisis management and stabilization. 2. Medication management to reduce current symptoms to base line and improve the  patient's overall level of functioning: -Continue Tegretol XR 236m bid for labile  mood -Continue Clonidine 0.41m q6 prn for opiate withdrawal -Continue Trazodone 515mqhs as needed for insomnia 3. Treat health problems as indicated. 4. Develop treatment plan to decrease risk of relapse upon discharge and the need for readmission. 5. Psycho-social education regarding relapse prevention and self care. 6. Health care follow up as needed for medical problems. 7. Restart home medications where appropriate.   Medical Decision Making Problem Points:  Established problem, worsening (2), Review of last therapy session (1) and Review of psycho-social stressors (1) Data Points:  Order Aims Assessment (2) Review or order clinical lab tests (1) Review of medication regiment & side effects (2) Review of new medications or change in dosage (2)  I certify that inpatient services furnished can reasonably be expected to improve the patient's condition.   AkCorena PilgrimMD 05/08/2013, 11:32 AM

## 2013-05-08 NOTE — [facility] Group Notes (Signed)
Adult Psychoeducational Group Note  Date:  05/08/2013 Time:  8:46 PM  Group Topic/Focus:  Wrap-Up Group:   The focus of this group is to help patients review their daily goal of treatment and discuss progress on daily workbooks.  Participation Level:  Minimal  Participation Quality:  Appropriate  Affect:  Appropriate  Cognitive:  Appropriate  Insight: Good  Engagement in Group:  Limited  Modes of Intervention:  Discussion  Additional Comments:  James Walsh stated that he went outside and played football and basketball.  He ate his meals.  He expressed that he wanted to speak with the doctor again and he can't wait to see his children when he leaves.  James Walsh was very attentive in group.  He answered questions when asked, other than that he sat and listened to his peers.  James Walsh, James Walsh 05/08/2013, 8:46 PM

## 2013-05-08 NOTE — [facility] Group Notes (Signed)
[facility]   05/08/2013 9:44 AM  Participation Quality:  Engaged  Mood/Affect:  Flat  Depression Rating:  denies  Anxiety Rating:  denies  Thoughts of Suicide:  No Will you contract for safety?   NA  Current AVH:  No  Plan for Discharge/Comments:  States he is ready to go.  Refused medication last night.  "I don't need it.  I don't want it."  States if parents don't let him return home, he has a place to go.  Also states he will follow up at [facility].  Transportation Means: unk  Supports: family [maybe]  [place], SutcliffeRodney B

## 2013-05-08 NOTE — Progress Notes (Signed)
Patient ID: James Walsh, male   DOB: [birth year], 28 y.o.   MRN: [id number] 05-08-2013 @ 1423 nursing admission note: D; pt has been very resistant to taking his medications. He initially refused his tegretol this am and did not take the medication until he was sitting with the MD and RN during rounds this am. A: when asked again, in front of the MD he took the tegretol. He denied any si/hi/av at this time and stated he "doesn't want to be in the hospital". [facility]. R: on his inventory sheet he wrote: slept well, appetite good, energy high, attention good with his depression and hopelessness both at 0. He denied any physical problems except he was given tylenol for a headache, that was resolved. After discharge he plans to " take good care of himself". His uds was positive for opiates and mj. RN will monitor and Q 15 min ck's continue.

## 2013-05-08 NOTE — Progress Notes (Signed)
D: Pt asleep. Resp are even and unlabored. No distress noted at this time.  A: 15 minute checks performed for safety. R: Pt safety maintained at this time.

## 2013-05-08 NOTE — [facility] Suicide Risk Assessment (Signed)
[facility] INPATIENT:  Family/Significant Other Suicide Prevention Education  Suicide Prevention Education:  Education Completed; No one has been identified by the patient as the family member/significant other with whom the patient will be residing, and identified as the person(s) who will aid the patient in the event of a mental health crisis (suicidal ideations/suicide attempt).  With written consent from the patient, the family member/significant other has been provided the following suicide prevention education, prior to the and/or following the discharge of the patient.  The suicide prevention education provided includes the following:  Suicide risk factors  Suicide prevention and interventions  National Suicide Hotline telephone number  [facility] assessment telephone number  [facility] Assistance 911  [facility] and/or Residential Mobile Crisis Unit telephone number  Request made of family/significant other to:  Remove weapons (e.g., guns, rifles, knives), all items previously/currently identified as safety concern.    Remove drugs/medications (over-the-counter, prescriptions, illicit drugs), all items previously/currently identified as a safety concern.  The family member/significant other verbalizes understanding of the suicide prevention education information provided.  The family member/significant other agrees to remove the items of safety concern listed above.  The patient did not endorse SI at the time of admission, nor did the patient c/o SI during the stay here.  SPE not required.   James Walsh, James Walsh 05/08/2013, 2:26 PM

## 2013-05-09 DIAGNOSIS — F1910 Other psychoactive substance abuse, uncomplicated: Secondary | ICD-10-CM

## 2013-05-09 NOTE — Progress Notes (Signed)
[facility] MD Progress Note  05/09/2013 10:28 AM JOHNTE PORTNOY  MRN:  [id number] Subjective:    Jaykob Minichiello is a 28 year old male who presented to [facility] under IVC initiated by his mother. The patient has a history of Bipolar Disorder, but has not been taking medications. His admission was prompted by appearing in court for a 50 B hearing that was initiated by his girlfriend. Kadden felt that this was taken out as a way to prevent him for having custody of his children. He reportedly became angry and threw a flower pot at nobody in particular. Patient states today during his psychiatric assessment "My mother took out papers. We had an argument. It was over money. She did not have to do that. I went to court to get my kids. They are just playing games with me. My mother tells people that I have Bipolar. But I don't need medications because there is nothing wrong with me. I get angry sometimes. But I think that anyone would have reacted like that. It does not mean that they need medications. I have been using heroine and marijuana. That's no big deal. Somebody told me that a little bit of drugs in my system would not prevent me from getting my kids. I really just want to get out of here. I don't need to be here." The patient shows very poor insight into his mental health problems. He has been previously diagnosed with Bipolar Disorder at [facility] in the past but does not believe that he has any mental illness. Patient appears to be greatly minimizing his symptoms. He is fixated on being discharged from the hospital.   During today's assessment, pt rates anxiety at minimal and denies depression. Pt denies SI, HI, and AVH, contracts for safety. Pt reports that he is "much better" than yesterday and feels his meds are working well. Pt states he is "a lot less angry and I can control my emotions a lot better now before they get out of hand". Pt reports "a lot more energy" but denies impulsivity, delusions,  irritability, or insomnia. Pt does report some trouble sleeping but states that it is "not that bad".   Diagnosis:   DSM5:  Substance/Addictive Disorders:  Opiate Dependence, Cannabis Dependence Depressive Disorders:  Major Depressive Disorder - Severe (296.23) Total Time spent with patient: 20 minutes  Axis I: Bipolar, Manic, Substance Abuse and Substance Induced Mood Disorder Axis II: Deferred Axis III:  Past Medical History  Diagnosis Date  . Mental disorder   . Bipolar disorder    Axis IV: other psychosocial or environmental problems and problems related to social environment Axis V: 41-50 serious symptoms  ADL's:  Intact  Sleep: Fair  Appetite:  Good  Suicidal Ideation:  Denies Homicidal Ideation:  Denies AEB (as evidenced by):  Psychiatric Specialty Exam: Physical Exam Full Physical Exam performed in ED; reviewed, stable, and I concur with this assessment.   Review of Systems  Constitutional: Negative.   HENT: Negative.   Eyes: Negative.   Respiratory: Negative.   Cardiovascular: Negative.   Gastrointestinal: Negative.   Genitourinary: Negative.   Musculoskeletal: Negative.   Skin: Negative.   Neurological: Negative.   Endo/Heme/Allergies: Negative.   Psychiatric/Behavioral: Negative.     Blood pressure 132/81, pulse 121, temperature 98 F (36.7 C), temperature source Oral, resp. rate 20, height 5' 7.72" (1.72 m), weight 73.029 kg (161 lb).Body mass index is 24.69 kg/(m^2).  General Appearance: Casual  Eye Contact::  Good  Speech:  Clear and Coherent  Volume:  Normal  Mood:  Euthymic  Affect:  Appropriate  Thought Process:  Coherent  Orientation:  Full (Time, Place, and Person)  Thought Content:  WDL  Suicidal Thoughts:  No  Homicidal Thoughts:  No  Memory:  Immediate;   Good Recent;   Good Remote;   Good  Judgement:  Fair  Insight:  Fair  Psychomotor Activity:  Normal  Concentration:  Good  Recall:  Good  Fund of Knowledge:Fair  Language:  Good  Akathisia:  NA  Handed:    AIMS (if indicated):     Assets:  Communication Skills Desire for Improvement Resilience  Sleep:  Number of Hours: 3.5   Musculoskeletal: Strength & Muscle Tone: within normal limits Gait & Station: normal Patient leans: N/A  Current Medications: Current Facility-Administered Medications  Medication Dose Route Frequency Provider Last Rate Last Dose  . acetaminophen (TYLENOL) tablet 650 mg  650 mg Oral Q6H PRN Kerry Hough, PA-C   650 mg at 05/09/13 0254  . alum & mag hydroxide-simeth (MAALOX/MYLANTA) 200-200-20 MG/5ML suspension 30 mL  30 mL Oral Q4H PRN Kerry Hough, PA-C      . carbamazepine (TEGRETOL XR) 12 hr tablet 200 mg  200 mg Oral BID Mojeed [name]   200 mg at 05/09/13 0731  . cloNIDine (CATAPRES) tablet 0.1 mg  0.1 mg Oral Q6H PRN Fransisca Kaufmann, NP      . hydrOXYzine (ATARAX/VISTARIL) tablet 25 mg  25 mg Oral Q6H PRN Kerry Hough, PA-C   25 mg at 05/08/13 2306  . loratadine (CLARITIN) tablet 10 mg  10 mg Oral Daily PRN Kerry Hough, PA-C   10 mg at 05/08/13 2306  . magnesium hydroxide (MILK OF MAGNESIA) suspension 30 mL  30 mL Oral Daily PRN Kerry Hough, PA-C      . nicotine (NICODERM CQ - dosed in mg/24 hours) patch 21 mg  21 mg Transdermal Daily Valente David, RN   21 mg at 05/08/13 1329  . OLANZapine zydis (ZYPREXA) disintegrating tablet 10 mg  10 mg Oral Q8H PRN Mojeed [name]      . sodium chloride (OCEAN) 0.65 % nasal spray 1 spray  1 spray Each Nare PRN Kristeen Mans, NP   1 spray at 05/09/13 0132  . traZODone (DESYREL) tablet 50 mg  50 mg Oral QHS,MR X 1 Kerry Hough, PA-C   50 mg at 05/08/13 2139    Lab Results: No results found for this or any previous visit (from the past 48 hour(s)).  Physical Findings: AIMS:  , ,  ,  ,    CIWA:  CIWA-Ar Total: 0 COWS:  COWS Total Score: 0  Treatment Plan Summary: Daily contact with patient to assess and evaluate symptoms and progress in treatment Medication  management  Plan: Review of chart, vital signs, medications, and notes.  1-Individual and group therapy  2-Medication management for depression and anxiety: Medications reviewed with the patient and she stated no untoward effects.  -Increase Trazodone to 100mg  qhs for sleep  3-Coping skills for depression, anxiety  4-Continue crisis stabilization and management  5-Address health issues--monitoring vital signs, stable  6-Treatment plan in progress to prevent relapse of depression and anxiety  Medical Decision Making Problem Points:  Established problem, stable/improving (1), Review of last therapy session (1) and Review of psycho-social stressors (1) Data Points:  Review or order clinical lab tests (1) Review or order medicine tests (1) Review of medication regiment &  side effects (2) Review of new medications or change in dosage (2)  I certify that inpatient services furnished can reasonably be expected to improve the patient's condition.   Beau FannyWithrow, John C, FNP-BC 05/09/2013, 10:28 AM Agree with assessment and plan Madie RenoIrving A. Dub MikesLugo, M.D.

## 2013-05-09 NOTE — [facility] Group Notes (Signed)
[facility] LCSW Group Therapy  05/09/2013 12:58 PM  Type of Therapy:  Group Therapy  Participation Level:  Minimal  Participation Quality:  Appropriate, Attentive and Supportive  Affect:  Flat  Cognitive:  Alert, Appropriate and Oriented  Insight:  Limited  Engagement in Therapy:  Limited  Modes of Intervention:  Discussion, Education, Exploration, Rapport Building, Socialization and Support  Summary of Progress/Problems: Pt was able to introduce himself and shared an affective coping skill when prompted.  Pt did not disrupt group, however did not contribute to discussion.   James Walsh, James Walsh 05/09/2013, 12:58 PM

## 2013-05-09 NOTE — Progress Notes (Signed)
Patient ID: James Walsh W Willy, male   DOB: April 20, 1985, 28 y.o.   MRN: [id number] Psychoeducational Group Note  Date:  05/09/2013 Time:0910am  Group Topic/Focus:  Identifying Needs:   The focus of this group is to help patients identify their personal needs that have been historically problematic and identify healthy behaviors to address their needs.  Participation Level:  Active  Participation Quality:  Appropriate  Affect:  Appropriate  Cognitive:  Appropriate  Insight:  Supportive  Engagement in Group:  Supportive  Additional Comments:  Inventory group   Valente DavidWeaver, Kaden Daughdrill Brooks 05/09/2013,12:55 PM

## 2013-05-09 NOTE — Progress Notes (Signed)
Patient ID: James Walsh, male   DOB: 1985/12/28, 28 y.o.   MRN: [id number] D. The patient is very quiet and forwards very little information. He was a little irritable this evening and does not initiate conversation. He is polite but guarded when answering direct questions addressed to him. He continues to voice that he does not belong here in the hospital and wants to get his kids back. C/o not sleeping well here and that the bed and pillow was the reason for is inability to sleep. 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 A. Met with the patient and encouraged him to attend evening group. Verbal support provided. Administered HS medication. R. Denies a/v hallucinations. Denies suicidal/homicidal ideations. Attended evening group with minimal participation. Compliant with HS medication for sleep.

## 2013-05-09 NOTE — Progress Notes (Signed)
Patient ID: James Walsh, male   DOB: September 21, 1985, 28 y.o.   MRN: [id number] D. The patient had an inappropriate grin on his face and observed softly laughing to himself while sitting in the dayroom.  States he is here in the hospital because his temper got him in trouble.  Patient appears to be greatly minimizing his symptoms. He is fixated on being discharged from the hospital and seeing his kids. Cold or allergy symptoms present. Observed sneezing frequently and has nasal congestion. A. Met with patient to assess. Encouraged to attend evening wrap up group. Reviewed and administered medications. R. Denied a/v hallucinations. Denied suicidal/homicidal ideation. Does not see his drug use as a problem. Does not feel he needs to be in the hospital. The patient shows very poor insight into his mental health problems.

## 2013-05-09 NOTE — Progress Notes (Signed)
The focus of this group is to help patients review their daily goal of treatment and discuss progress on daily workbooks. Pt attended the evening group session but responded minimally to discussion prompts from the Writer. Pt shared that nothing good happened to him today and that he was ready to go home. Pt was encouraged by the RN and the Writer to discuss his desire to discharge with his case manager and psychiatrist. He reported having no additional needs from Nursing Staff this evening. Pt appeared bored and disinterested in the group.

## 2013-05-09 NOTE — Progress Notes (Signed)
Patient ID: James Walsh, male   DOB: 11/16/1985, 28 y.o.   MRN: [id number] Psychoeducational Group Note  Date:  05/09/2013 Time:0930am  Group Topic/Focus:  Identifying Needs:   The focus of this group is to help patients identify their personal needs that have been historically problematic and identify healthy behaviors to address their needs.  Participation Level:  Active  Participation Quality:  Appropriate  Affect:  Appropriate  Cognitive:  Appropriate  Insight:  Supportive  Engagement in Group:  Supportive  Additional Comments:  Healthy coping skills.   James Walsh, James Walsh 05/09/2013,12:56 PM

## 2013-05-09 NOTE — Progress Notes (Signed)
Patient ID: James Walsh, male   DOB: [birth year], 28 y.o.   MRN: [id number] 05-09-13 nursing shift note @ 1041 D: pt is started taking his medications and is no longer refusing them. He still has some concerns about his children being in custody with DSS. Pt is going to groups and being more approachable. A: staff have been more supportive and continues to encourage this patient. R: he stated he will continue to work on his anger problem. On his inventory sheet he wrote: requested sleep medication, appetite good, energy hyper, attention good with his depression and hopelessness both at 1. He denies any si/hi/av. After discharge he plans to "workout". RN will monitor and Q 15 min ck's continue.

## 2013-05-10 MED ORDER — TRAZODONE HCL 100 MG PO TABS
100.0000 mg | ORAL_TABLET | Freq: Every evening | ORAL | Status: DC | PRN
  Administered 2013-05-10 – 2013-05-13 (×3): 100 mg via ORAL
  Filled 2013-05-10 (×8): qty 1

## 2013-05-10 NOTE — Progress Notes (Signed)
Patient ID: James Walsh, male   DOB: Jul 01, 1985, 28 y.o.   MRN: [id number] [facility] MD Progress Note  05/10/2013 11:26 AM ELMER MERWIN  MRN:  [id number] Subjective:    James Walsh is a 28 year old male who presented to [facility] under IVC initiated by his mother. The patient has a history of Bipolar Disorder, but has not been taking medications. His admission was prompted by appearing in court for a 50 B hearing that was initiated by his girlfriend. Ignatz felt that this was taken out as a way to prevent him for having custody of his children. He reportedly became angry and threw a flower pot at nobody in particular. Patient states today during his psychiatric assessment "My mother took out papers. We had an argument. It was over money. She did not have to do that. I went to court to get my kids. They are just playing games with me. My mother tells people that I have Bipolar. But I don't need medications because there is nothing wrong with me. I get angry sometimes. But I think that anyone would have reacted like that. It does not mean that they need medications. I have been using heroine and marijuana. That's no big deal. Somebody told me that a little bit of drugs in my system would not prevent me from getting my kids. I really just want to get out of here. I don't need to be here." The patient shows very poor insight into his mental health problems. He has been previously diagnosed with Bipolar Disorder at [facility] in the past but does not believe that he has any mental illness. Patient appears to be greatly minimizing his symptoms. He is fixated on being discharged from the hospital.   During today's assessment, pt rates anxiety at minimal and denies depression. Pt denies SI, HI, and AVH, contracts for safety. Reports fair sleep and good appetite, although pt attributes the poor sleep to the environment.  Pt reports that he is "feeling good and I'm really ready to go home." Reports that he is not having  any anger concerns or outbursts and that he feels very stable at this time. Pt is inquiring about discharging Monday morning and was told that he would have to talk to Dr. Jannifer Franklin to determine final discharge disposition. Pt is agreeable to this plan.    Diagnosis:   DSM5:  Substance/Addictive Disorders:  Opiate Dependence, Cannabis Dependence Depressive Disorders:  Major Depressive Disorder - Severe (296.23) Total Time spent with patient: 20 minutes  Axis I: Bipolar, Manic, Substance Abuse and Substance Induced Mood Disorder Axis II: Deferred Axis III:  Past Medical History  Diagnosis Date  . Mental disorder   . Bipolar disorder    Axis IV: other psychosocial or environmental problems and problems related to social environment Axis V: 41-50 serious symptoms  ADL's:  Intact  Sleep: Fair  Appetite:  Good  Suicidal Ideation:  Denies Homicidal Ideation:  Denies AEB (as evidenced by):  Psychiatric Specialty Exam: Physical Exam Full Physical Exam performed in ED; reviewed, stable, and I concur with this assessment.   Review of Systems  Constitutional: Negative.   HENT: Negative.   Eyes: Negative.   Respiratory: Negative.   Cardiovascular: Negative.   Gastrointestinal: Negative.   Genitourinary: Negative.   Musculoskeletal: Negative.   Skin: Negative.   Neurological: Negative.   Endo/Heme/Allergies: Negative.   Psychiatric/Behavioral: Negative.     Blood pressure 141/86, pulse 100, temperature 98.2 F (36.8 C), temperature source Oral,  resp. rate 18, height 5' 7.72" (1.72 m), weight 73.029 kg (161 lb).Body mass index is 24.69 kg/(m^2).  General Appearance: Casual  Eye Contact::  Good  Speech:  Clear and Coherent  Volume:  Normal  Mood:  Euthymic  Affect:  Appropriate  Thought Process:  Coherent  Orientation:  Full (Time, Place, and Person)  Thought Content:  WDL  Suicidal Thoughts:  No  Homicidal Thoughts:  No  Memory:  Immediate;   Good Recent;    Good Remote;   Good  Judgement:  Fair  Insight:  Fair  Psychomotor Activity:  Normal  Concentration:  Good  Recall:  Good  Fund of Knowledge:Fair  Language: Good  Akathisia:  NA  Handed:    AIMS (if indicated):     Assets:  Communication Skills Desire for Improvement Resilience  Sleep:  Number of Hours: 4.75   Musculoskeletal: Strength & Muscle Tone: within normal limits Gait & Station: normal Patient leans: N/A  Current Medications: Current Facility-Administered Medications  Medication Dose Route Frequency Provider Last Rate Last Dose  . acetaminophen (TYLENOL) tablet 650 mg  650 mg Oral Q6H PRN Kerry Hough, PA-C   650 mg at 05/09/13 0254  . alum & mag hydroxide-simeth (MAALOX/MYLANTA) 200-200-20 MG/5ML suspension 30 mL  30 mL Oral Q4H PRN Kerry Hough, PA-C      . carbamazepine (TEGRETOL XR) 12 hr tablet 200 mg  200 mg Oral BID Mojeed [name]   200 mg at 05/10/13 0740  . cloNIDine (CATAPRES) tablet 0.1 mg  0.1 mg Oral Q6H PRN Fransisca Kaufmann, NP      . hydrOXYzine (ATARAX/VISTARIL) tablet 25 mg  25 mg Oral Q6H PRN Kerry Hough, PA-C   25 mg at 05/10/13 0331  . loratadine (CLARITIN) tablet 10 mg  10 mg Oral Daily PRN Kerry Hough, PA-C   10 mg at 05/09/13 2154  . magnesium hydroxide (MILK OF MAGNESIA) suspension 30 mL  30 mL Oral Daily PRN Kerry Hough, PA-C      . nicotine (NICODERM CQ - dosed in mg/24 hours) patch 21 mg  21 mg Transdermal Daily Valente David, RN   21 mg at 05/08/13 1329  . OLANZapine zydis (ZYPREXA) disintegrating tablet 10 mg  10 mg Oral Q8H PRN Mojeed [name]      . sodium chloride (OCEAN) 0.65 % nasal spray 1 spray  1 spray Each Nare PRN Kristeen Mans, NP   1 spray at 05/09/13 0132  . traZODone (DESYREL) tablet 50 mg  50 mg Oral QHS,MR X 1 Kerry Hough, PA-C   50 mg at 05/09/13 2152    Lab Results: No results found for this or any previous visit (from the past 48 hour(s)).  Physical Findings: AIMS:  , ,  ,  ,    CIWA:  CIWA-Ar  Total: 0 COWS:  COWS Total Score: 0  Treatment Plan Summary: Daily contact with patient to assess and evaluate symptoms and progress in treatment Medication management  Plan: Review of chart, vital signs, medications, and notes.  1-Individual and group therapy  2-Medication management for depression and anxiety: Medications reviewed with the patient and she stated no untoward effects.  -ContinueTrazodone 100mg  qhs for sleep  3-Coping skills for depression, anxiety  4-Continue crisis stabilization and management  5-Address health issues--monitoring vital signs, stable  6-Treatment plan in progress to prevent relapse of depression and anxiety  Medical Decision Making Problem Points:  Established problem, stable/improving (1), Review of last therapy session (  1) and Review of psycho-social stressors (1) Data Points:  Review or order clinical lab tests (1) Review or order medicine tests (1) Review of medication regiment & side effects (2) Review of new medications or change in dosage (2)  I certify that inpatient services furnished can reasonably be expected to improve the patient's condition.   Beau FannyWithrow, John C, FNP-BC 05/10/2013, 11:26 AM Agree with assessment and plan Madie RenoIrving A. Dub MikesLugo, M.D.

## 2013-05-10 NOTE — Progress Notes (Signed)
Adult Psychoeducational Group Note  Date:  05/10/2013 Time:  9:24 PM  Group Topic/Focus:  Wrap-Up Group:   The focus of this group is to help patients review their daily goal of treatment and discuss progress on daily workbooks.  Participation Level:  Active  Participation Quality:  Appropriate  Affect:  Appropriate  Cognitive:  Appropriate  Insight: Appropriate  Engagement in Group:  Engaged  Modes of Intervention:  Discussion  Additional Comments:  The patient expressed that his coping skill is to remove himself  from the situation.  Octavio Mannshigpen, Bertha Earwood Lee 05/10/2013, 9:24 PM

## 2013-05-10 NOTE — Progress Notes (Signed)
Patient ID: James Walsh, male   DOB: 1985/10/08, 28 y.o.   MRN: [id number] Psychoeducational Group Note  Date:  05/10/2013 Time:  0910am  Group Topic/Focus:  Making Healthy Choices:   The focus of this group is to help patients identify negative/unhealthy choices they were using prior to admission and identify positive/healthier coping strategies to replace them upon discharge.  Participation Level:  Active  Participation Quality:  Appropriate  Affect:  Anxious  Cognitive:  Appropriate  Insight:  Supportive  Engagement in Group:  Supportive  Additional Comments:  Inventory group   Valente DavidWeaver, Jaidynn Balster Brooks 05/10/2013,11:12 AM

## 2013-05-10 NOTE — Progress Notes (Signed)
Patient ID: James Walsh, male   DOB: [birth year], 28 y.o.   MRN: [id number] Psychoeducational Group Note  Date:  05/10/2013 Time:  0930am  Group Topic/Focus:  Making Healthy Choices:   The focus of this group is to help patients identify negative/unhealthy choices they were using prior to admission and identify positive/healthier coping strategies to replace them upon discharge.  Participation Level:  Active  Participation Quality:  Appropriate  Affect:  Anxious  Cognitive:  Appropriate  Insight:  Supportive  Engagement in Group:  Supportive  Additional Comments:  Healthy support group   Valente DavidWeaver, Andra Matsuo Brooks 05/10/2013,11:13 AM

## 2013-05-10 NOTE — Progress Notes (Signed)
Patient ID: James Walsh, male   DOB: 03-04-1985, 28 y.o.   MRN: [id number] 05-10-2013 nursing shift note: D:pt rates anxiety as moderate and denies any depression. Pt denies si/hi/av. Reports fair sleep and good appetite. A: during the RN assessment pt states that he really ready to go home. Staff continues to be supportive and encouraging.  Pt stated he is ready for d/c on Monday. R: on his inventory sheet he wrote:slept poor, appetite good with his depression and hopelessness both at 1. After discharge he plans to "workout and get a job". RN will monitor and Q 15 min ck's continue.

## 2013-05-10 NOTE — Progress Notes (Signed)
Patient ID: James Walsh, male   DOB: 03/14/1985, 28 y.o.   MRN: [id number] D. The patient has an anxious mood and affect. Remains guarded and forwards little. Continues to minimize the reason for his stay here at [facility] and blames his mother. States he plans on being discharged tomorrow. No follow up plan in place. A. Met with the patient to assess. Encouraged to attend evening group. Reviewed and administered HS medication for sleep. R. The patient attended evening group with minimal participation. Denied thoughts to harm self or others.

## 2013-05-11 DIAGNOSIS — F1994 Other psychoactive substance use, unspecified with psychoactive substance-induced mood disorder: Secondary | ICD-10-CM

## 2013-05-11 DIAGNOSIS — F3110 Bipolar disorder, current episode manic without psychotic features, unspecified: Principal | ICD-10-CM

## 2013-05-11 NOTE — Progress Notes (Signed)
D: Patient at the medication window on first approach.  Patient states he had a good day.  Patient states he is ready to be discharged.  Patient states he is working on his anger.  Patient states he has learned different ways to cope when he gets angry.  Patiet states he has learned that he needs to walk away from situations when he gets angry so that he does not get in trouble.  Patient denies SI/HI and denies AVH. A: Staff to monitor Q 15 mins for safety.  Encouragement and support offered.  Scheduled medications administered per orders.  Maalox administered prn for indigestion. R: Patient remains safe on the unit.  Patient attended group tonight.  Patient taking administered medications.  Patient visible on the unit.

## 2013-05-11 NOTE — Progress Notes (Signed)
Adult Psychoeducational Group Note  Date:  05/11/2013 Time:  10:20 AM  Group Topic/Focus:  Wellness Toolbox:   The focus of this group is to discuss various aspects of wellness, balancing those aspects and exploring ways to increase the ability to experience wellness.  Patients will create a wellness toolbox for use upon discharge.  Participation Level:  None  Participation Quality:  Inattentive  Affect:  Flat  Cognitive:  Appropriate  Insight: None  Engagement in Group:  Limited  Modes of Intervention:  Education  Additional Comments:  Pt attended group, but did not say anything.  Berlin Hunuttle, Kitti Mcclish M 05/11/2013, 10:20 AM

## 2013-05-11 NOTE — Progress Notes (Signed)
Patient ID: James Walsh, male   DOB: December 26, 1985, 28 y.o.   MRN: [id number] [facility] Note  05/11/2013 10:54 AM James Learenton W Birkel  MRN:  [id number] Subjective:   Patient states "I don't need to be here. The medications will help me more out there. I feel angry because I'm not leaving. I was told I could leave today. There is nothing wrong with me. I am taking the medication like I should."   Objective:  Patient is visible on the unit and attending the scheduled groups. He remains with poor insight into his reasons for admission. Patient is pleasant upon first greeting with Clinical research associatewriter. However, he quickly becomes argumentative and agitated upon finding out he will not be discharged today. Patient asserts that he was promised discharge by the weekend staff. He angrily reports that he has not seen a Doctor since admission. Patient was reminded of his meeting with MD on Friday where he was encouraged to start taking his prescribed medication at that time. Patient terminated the conversation early due to his angry stating "Well I guess we are done here." The plan was explained to him that discharge was planned for Wednesday after tegretol level is obtained. Patient also attempted to argue regarding why he needed a therapeutic blood level. He has been complaint with his Tegretol over the weekend.   Diagnosis:   DSM5:  Substance/Addictive Disorders:  Opiate Dependence, Cannabis Dependence Depressive Disorders:   Total Time spent with patient: 20 minutes  Axis I: Bipolar, Manic and Substance Induced Mood Disorder Axis II: Deferred Axis III:  Past Medical History  Diagnosis Date  . Mental disorder   . Bipolar disorder    Axis IV: other psychosocial or environmental problems and problems related to social environment Axis V: 41-50 serious symptoms  ADL's:  Intact  Sleep: Fair  Appetite:  Good  Suicidal Ideation:  Denies Homicidal Ideation:  Denies AEB (as evidenced  by):  Psychiatric Specialty Exam: Physical Exam   Review of Systems  Constitutional: Negative.   HENT: Negative.   Eyes: Negative.   Respiratory: Negative.   Cardiovascular: Negative.   Gastrointestinal: Negative.   Genitourinary: Negative.   Musculoskeletal: Negative.   Skin: Negative.   Neurological: Negative.   Endo/Heme/Allergies: Negative.   Psychiatric/Behavioral: Positive for depression and substance abuse. Negative for suicidal ideas, hallucinations and memory loss. The patient is nervous/anxious and has insomnia.     Blood pressure 117/83, pulse 105, temperature 97.4 F (36.3 C), temperature source Oral, resp. rate 16, height 5' 7.72" (1.72 m), weight 73.029 kg (161 lb).Body mass index is 24.69 kg/(m^2).  General Appearance: Casual  Eye Contact::  Good  Speech:  Clear and Coherent  Volume:  Increased  Mood:  Angry  Affect:  Labile  Thought Process:  Coherent  Orientation:  Full (Time, Place, and Person)  Thought Content:  WDL  Suicidal Thoughts:  No  Homicidal Thoughts:  No  Memory:  Immediate;   Good Recent;   Good Remote;   Good  Judgement:  Fair  Insight:  Fair  Psychomotor Activity:  Normal  Concentration:  Good  Recall:  Good  Fund of Knowledge:Fair  Language: Good  Akathisia:  NA  Handed:  Right  AIMS (if indicated):     Assets:  Communication Skills Desire for Improvement Resilience  Sleep:  Number of Hours: 0   Musculoskeletal: Strength & Muscle Tone: within normal limits Gait & Station: normal Patient leans: N/A  Current Medications: Current Facility-Administered Medications  Medication Dose  Route Frequency Provider Last Rate Last Dose  . acetaminophen (TYLENOL) tablet 650 mg  650 mg Oral Q6H PRN Kerry Hough, PA-C   650 mg at 05/10/13 2148  . alum & mag hydroxide-simeth (MAALOX/MYLANTA) 200-200-20 MG/5ML suspension 30 mL  30 mL Oral Q4H PRN Kerry Hough, PA-C      . carbamazepine (TEGRETOL XR) 12 hr tablet 200 mg  200 mg Oral BID  Shae Hinnenkamp   200 mg at 05/11/13 0843  . cloNIDine (CATAPRES) tablet 0.1 mg  0.1 mg Oral Q6H PRN Fransisca Kaufmann, NP      . hydrOXYzine (ATARAX/VISTARIL) tablet 25 mg  25 mg Oral Q6H PRN Kerry Hough, PA-C   25 mg at 05/10/13 0331  . loratadine (CLARITIN) tablet 10 mg  10 mg Oral Daily PRN Kerry Hough, PA-C   10 mg at 05/10/13 2308  . magnesium hydroxide (MILK OF MAGNESIA) suspension 30 mL  30 mL Oral Daily PRN Kerry Hough, PA-C      . nicotine (NICODERM CQ - dosed in mg/24 hours) patch 21 mg  21 mg Transdermal Daily Valente David, RN   21 mg at 05/08/13 1329  . OLANZapine zydis (ZYPREXA) disintegrating tablet 10 mg  10 mg Oral Q8H PRN Alexsys Eskin   10 mg at 05/11/13 0341  . sodium chloride (OCEAN) 0.65 % nasal spray 1 spray  1 spray Each Nare PRN Kristeen Mans, NP   1 spray at 05/09/13 0132  . traZODone (DESYREL) tablet 100 mg  100 mg Oral QHS,MR X 1 Beau Fanny, FNP   100 mg at 05/10/13 2308    Lab Results: No results found for this or any previous visit (from the past 48 hour(s)).  Physical Findings: AIMS:  , ,  ,  ,    CIWA:  CIWA-Ar Total: 0 COWS:  COWS Total Score: 0  Treatment Plan Summary: Daily contact with patient to assess and evaluate symptoms and progress in treatment Medication management  Plan: Review of chart, vital signs, medications, and notes.  1-Individual and group therapy  2-Medication management for depression and anxiety: Medications reviewed with the patient and she stated no untoward effects. -Continue Tegretol XR 200 mg BID for improved mood stability  -ContinueTrazodone 100mg  qhs for sleep 3-Coping skills for depression, anxiety  4-Continue crisis stabilization and management  5-Address health issues--monitoring vital signs, stable  6-Treatment plan in progress to prevent relapse of depression and anxiety 7-Obtain Tegretol level on 05/13/13. Anticipate d/c on Wednesday.   Medical Decision Making Problem Points:  Established  problem, stable/improving (1), Review of last therapy session (1) and Review of psycho-social stressors (1) Data Points:  Review or order clinical lab tests (1) Review of medication regiment & side effects (2)  I certify that inpatient services furnished can reasonably be expected to improve the patient's condition.   Fransisca Kaufmann, NP-C 05/11/2013, 10:54 AM  Patient seen, evaluated and I agree with notes by Nurse Practitioner. Thedore Mins, MD

## 2013-05-11 NOTE — Progress Notes (Signed)
Adult Psychoeducational Group Note  Date:  05/11/2013 Time:  10:05 PM  Group Topic/Focus:  Wrap-Up Group:   The focus of this group is to help patients review their daily goal of treatment and discuss progress on daily workbooks.  Participation Level:  Minimal  Participation Quality:  Drowsy  Affect:  Flat  Cognitive:  Lacking  Insight: Limited  Engagement in Group:  Limited  Modes of Intervention:  Education, Exploration, Socialization and Support  Additional Comments:  Patient attended and participated in group tonight. He reports that today he went outside and played basket ball. He walk the halls, took a shower, went for meals and attended his groups. The patient advised that when he is well he is taking better care of himself and smiling more.  Lita MainsFrancis, Missi Mcmackin [facility] 05/11/2013, 10:05 PM

## 2013-05-11 NOTE — Progress Notes (Signed)
Patient ID: James Walsh, male   DOB: [birth year], 28 y.o.   MRN: [id number]  D: Pt. Denies SI/HI and A/V Hallucinations today. Patient does not report any pain or discomfort at this time. Patient is seen in the milieu and walking calmly around the hall and dayroom.   A: Support and encouragement provided to the patient to come to writer with any questions or concerns.scheduled medications given to patient per physicians orders.  R: Patient is receptive and cooperative but minimal with Clinical research associatewriter. Patient does not have any concerns at this time. Q15 minute checks are maintained for safety.

## 2013-05-11 NOTE — [facility] Group Notes (Signed)
[facility] LCSW Group Therapy  05/11/2013 2:22 PM  Type of Therapy:  Group Therapy  Participation Level:  Minimal  Participation Quality:  Attentive  Affect:  Appropriate  Cognitive:  Alert and Oriented  Insight:  Developing/Improving  Engagement in Therapy:  Engaged  Modes of Intervention:  Discussion, Exploration and Problem-solving  Summary of Progress/Problems:  In this group patients will be encouraged to explore what they see as obstacles to their own wellness and recovery. They will be guided to discuss their thoughts, feelings, and behaviors related to these obstacles. The group will process together ways to cope with barriers, with attention given to specific choices patients can make. Each patient will be challenged to identify changes they are motivated to make in order to overcome their obstacles.  James Walsh:   reported early in group he did not feel comfortable sharing his personal obstacles in group setting and wanted to listen. He warmed up eventually and commented on a few obstacles such as failure and not living up to the person you want to be.  He was attentive and respectful to all members.  Reports he wants to leave the hospital knowing more than when he came in.   James Walsh, [name] 05/11/2013, 2:22 PM

## 2013-05-11 NOTE — [facility] Group Notes (Signed)
[facility]   05/11/2013 9:46 AM  Participation Quality:  Minimal  Mood/Affect:  Blunted and Flat  Depression Rating:  0  Anxiety Rating:  0  Thoughts of Suicide:  No Will you contract for safety?   Yes  Current AVH:  No  Plan for Discharge/Comments:  Wes was very short with his answers, reports feeling better, taking his medications and ready for DC.  Wes will DC on Wednesday.  His plan is return home with his parents and continue with the [facility].  Transportation Means: mother  Supports:  family  Raye SorrowCoble, Kaely Hollan N

## 2013-05-12 DIAGNOSIS — F1210 Cannabis abuse, uncomplicated: Secondary | ICD-10-CM

## 2013-05-12 DIAGNOSIS — F1110 Opioid abuse, uncomplicated: Secondary | ICD-10-CM

## 2013-05-12 DIAGNOSIS — F319 Bipolar disorder, unspecified: Secondary | ICD-10-CM

## 2013-05-12 NOTE — Progress Notes (Signed)
Patient ID: James Walsh, male   DOB: 26-Jun-1985, 28 y.o.   MRN: [id number] [facility] MD Progress Note  05/12/2013 10:29 AM TARIG ZIMMERS  MRN:  [id number] Subjective: " I am tired of being here,  just want to go home.''  Objective: Patient seen and chart is reviewed. Patient reports having difficulty sleeping due to racing thoughts and excessive worries about being in the hospital. He reports that he is compliant with his medications and denies any adverse reactions to it. He also denies craving for drugs and alcohol. He is endorsing decreased mood swings and agitation. He is less intrusive, argumentative, defiant or oppositional. He is requesting to be discharged soon so that he can go and work on getting his acts together and be a good father to his children. 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 None reported    Axis IV: economic problems, other psychosocial or environmental problems, problems related to social environment and problems with primary support group  ADL's:  Intact  Sleep: Fair  Appetite:  Fair  Suicidal Ideation:  denies Homicidal Ideation:  denies AEB (as evidenced by):  Psychiatric Specialty Exam: Physical Exam  Psychiatric: His speech is normal. His mood appears anxious. He is aggressive. Thought content is paranoid. Cognition and memory are normal. He expresses impulsivity.    Review of Systems  Constitutional: Negative.   HENT: Negative.   Eyes: Negative.    Respiratory: Negative.   Cardiovascular: Negative.   Gastrointestinal: Negative.   Genitourinary: Negative.   Musculoskeletal: Negative.   Skin: Negative.   Neurological: Negative.   Endo/Heme/Allergies: Negative.   Psychiatric/Behavioral: Positive for substance abuse. The patient is nervous/anxious.     Blood pressure 125/82, pulse 121, temperature 96.7 F (35.9 C), temperature source Oral, resp. rate 20, height 5' 7.72" (1.72 m), weight 73.029 kg (161 lb).Body mass index is 24.69 kg/(m^2).  General Appearance: Fairly Groomed  Patent attorney::  Minimal  Speech:  normal  Volume:  Inormal  Mood:  Irritable  Affect:  Full Range  Thought Process:  Goal Directed  Orientation:  Full (Time, Place, and Person)  Thought Content:  Negative  Suicidal Thoughts:  No  Homicidal Thoughts:  No  Memory:  Immediate;   Fair Recent;   Fair Remote;   Fair  Judgement:  Poor  Insight:  Lacking  Psychomotor Activity:  Increased  Concentration:  Fair  Recall:  [name] of Knowledge:Fair  Language: Good  Akathisia:  No  Handed:  Right  AIMS (if indicated):     Assets:  Communication Skills Physical Health  Sleep:  Number of Hours: 4   Musculoskeletal: Strength & Muscle Tone: within normal limits Gait & Station: normal Patient leans: N/A  Current Medications: Current Facility-Administered Medications  Medication Dose Route Frequency Provider Last Rate Last Dose  . acetaminophen (TYLENOL) tablet 650 mg  650 mg Oral Q6H PRN Kerry Hough, PA-C   650 mg  at 05/10/13 2148  . alum & mag hydroxide-simeth (MAALOX/MYLANTA) 200-200-20 MG/5ML suspension 30 mL  30 mL Oral Q4H PRN Kerry HoughSpencer E Simon, PA-C   30 mL at 05/11/13 2000  . carbamazepine (TEGRETOL XR) 12 hr tablet 200 mg  200 mg Oral BID Yanet Balliet   200 mg at 05/12/13 0823  . cloNIDine (CATAPRES) tablet 0.1 mg  0.1 mg Oral Q6H PRN Fransisca KaufmannLaura Davis, NP      . hydrOXYzine (ATARAX/VISTARIL) tablet 25 mg  25 mg Oral Q6H PRN Kerry HoughSpencer E Simon, PA-C    25 mg at 05/10/13 0331  . loratadine (CLARITIN) tablet 10 mg  10 mg Oral Daily PRN Kerry HoughSpencer E Simon, PA-C   10 mg at 05/10/13 2308  . magnesium hydroxide (MILK OF MAGNESIA) suspension 30 mL  30 mL Oral Daily PRN Kerry HoughSpencer E Simon, PA-C      . nicotine (NICODERM CQ - dosed in mg/24 hours) patch 21 mg  21 mg Transdermal Daily Valente DavidStephen Brooks Weaver, RN   21 mg at 05/08/13 1329  . OLANZapine zydis (ZYPREXA) disintegrating tablet 10 mg  10 mg Oral Q8H PRN Jarad Barth   10 mg at 05/11/13 0341  . sodium chloride (OCEAN) 0.65 % nasal spray 1 spray  1 spray Each Nare PRN Kristeen MansFran E Hobson, NP   1 spray at 05/09/13 0132  . traZODone (DESYREL) tablet 100 mg  100 mg Oral QHS,MR X 1 Beau FannyJohn C Withrow, FNP   100 mg at 05/10/13 2308    Lab Results:  No results found for this or any previous visit (from the past 48 hour(s)).  Physical Findings: AIMS: Facial and Oral Movements Muscles of Facial Expression: None, normal Lips and Perioral Area: None, normal Jaw: None, normal Tongue: None, normal,Extremity Movements Upper (arms, wrists, hands, fingers): None, normal Lower (legs, knees, ankles, toes): None, normal, Trunk Movements Neck, shoulders, hips: None, normal, Overall Severity Severity of abnormal movements (highest score from questions above): None, normal Incapacitation due to abnormal movements: None, normal Patient's awareness of abnormal movements (rate only patient's report): No Awareness, Dental Status Current problems with teeth and/or dentures?: No Does patient usually wear dentures?: No  CIWA:  CIWA-Ar Total: 0 COWS:  COWS Total Score: 0  Treatment Plan Summary: Daily contact with patient to assess and evaluate symptoms and progress in treatment Medication management  Plan:1. Admit for crisis management and stabilization. 2. Medication management to reduce current symptoms to base line and improve the  patient's overall level of functioning: -Continue Tegretol XR 200mg  bid for labile  mood -Continue Clonidine 0.1mg  q6 prn for opiate withdrawal -Increase Trazodone to 150mg  qhs  for insomnia 3. Treat health problems as indicated. 4. Develop treatment plan to decrease risk of relapse upon discharge and the need for readmission. 5. Psycho-social education regarding relapse prevention and self care. 6. Health care follow up as needed for medical problems. 7. Tegretol level on 05/13/13   Medical Decision Making Problem Points:  Established problem, improving(1), Review of last therapy session (1) and Review of psycho-social stressors (1) Data Points:  Order Aims Assessment (2) Review or order clinical lab tests (1) Review of medication regiment & side effects (2) Review of new medications or change in dosage (2)  I certify that inpatient services furnished can reasonably be expected to improve the patient's condition.   Thedore MinsAkintayo, Adyson Vanburen, MD 05/12/2013, 10:29 AM

## 2013-05-12 NOTE — Progress Notes (Addendum)
Patient ID: James Walsh, male   DOB: [birth year], 28 y.o.   MRN: [id number] D: Patient remains focused on discharge.  He denies any depressive symptoms, rating his depression as a 1.  He reports denies SI.  He is preoccupied with attempting to get custody of his kids.  He reports decreased irritability, however, when MD informed him he would not be going home today, he became angry and stated, "I'll go home tomorrow then!"  Patient has been attending some groups, however, he exhibits minimal participation. Patient plans to follow up outpatient with [facility].  Patient has poor insight regarding his mental health and substance abuse.  A: continue to monitor medication management and MD orders.  Safety checks completed every 15 minutes per protocol.  R: patient has minimal interaction with staff; his behavior is appropriate.

## 2013-05-12 NOTE — Progress Notes (Signed)
Adult Psychoeducational Group Note  Date:  05/12/2013 Time: 0900  Group Topic/Focus:  Wellness Toolbox:   The focus of this group is to discuss various aspects of wellness, balancing those aspects and exploring ways to increase the ability to experience wellness.  Patients will create a wellness toolbox for use upon discharge.  Participation Level:  Active  Participation Quality:  Appropriate  Affect:  Appropriate  Cognitive:  Appropriate  Insight: Appropriate  Engagement in Group:  Engaged  Modes of Intervention:  Education  Additional Comments:    Hilda Wexler L 05/12/2013, 3:13 PM

## 2013-05-12 NOTE — Progress Notes (Signed)
D: Patient in hte dayroom on approach.  Patient has angry affect.  Patient states he is upset with his mother because she tries to irritate him on purpose.  Patient's mother visited this evening.  Patient states, "I am ready to go home."  Patient states his plan is to apply coping skills that he has learned to his life.  Patient states, "I learned a lot here."  Patient denies SI/HI and denies AVH. A: Staff to monitor Q 15 mins for safety.  Encouragement and support offered.  Scheduled medications administered per orders.  Tylenol administered prn for a headache. R: Patient remains safe on the unit.  Patient attended group tonight.  Patient visible on the unit.  Patient taking administered medications.

## 2013-05-12 NOTE — [facility] Group Notes (Signed)
[facility] LCSW Group Therapy  05/12/2013 1:04 PM  Type of Therapy:  Group Therapy  Participation Level:  Minimal  Participation Quality:  Attentive  Would listen more than speak or give opinions  Affect:  Appropriate  Cognitive:  Alert and Oriented  Insight:  Limited  Engagement in Therapy:  Lacking  Modes of Intervention:  Discussion, Exploration, Problem-solving and Support  Summary of Progress/Problems: This group will allow patients to explore their thoughts and feelings about diagnoses they have received. Patients will be guided to explore their level of understanding and acceptance of these diagnoses. Facilitator will encourage patients to process their thoughts and feelings about the reactions of others to their diagnosis, and will guide patients in identifying ways to discuss their diagnosis with significant others in their lives.  James Walsh again listened more than he responded to questions and processing topic. He was able to engage in last questions, when LCSW asked patients, "aside from you diagnosis, what else can you tell me about yourself".  James Walsh reports he likes sports, was a Print production plannerbaseball player, playing short stop position.  Other than this comment, he was attentive and listened to others.   James Walsh, James Walsh N 05/12/2013, 1:04 PM

## 2013-05-12 NOTE — Progress Notes (Signed)
Adult Psychoeducational Group Note  Date:  05/12/2013 Time:  9:11 PM  Group Topic/Focus:  Wrap-Up Group:   The focus of this group is to help patients review their daily goal of treatment and discuss progress on daily workbooks.  Participation Level:  Minimal  Participation Quality:  Appropriate  Affect:  Flat  Cognitive:  Appropriate  Insight: Improving and Limited  Engagement in Group:  Limited  Modes of Intervention:  Exploration, Socialization and Support  Additional Comments:  Patient attended and participated in group tonight. He reports that he went outside today, he showered, went for his meals and went to groups. For his recovery her plans to continue at the [facility] for services.  Lita MainsFrancis, Norva Bowe [facility] 05/12/2013, 9:11 PM

## 2013-05-12 NOTE — Tx Team (Signed)
  Interdisciplinary Treatment Plan Update   Date Reviewed:  05/12/2013  Time Reviewed:  10:47 AM  Progress in Treatment:   Attending groups: Yes Participating in groups: Yes Taking medication as prescribed: Yes Tolerating medication: Yes Family/Significant other contact made: Yes  Patient understands diagnosis: Yes, AEB patient compliant with programming and reporting he wants to learn more about mental health and SA before he DCs in order to be a good father. Discussing patient identified problems/goals with staff: Yes See initial care plan Medical problems stabilized or resolved: Yes Denies suicidal/homicidal ideation: Yes In tx team Patient has not harmed self or others: Yes  For review of initial/current patient goals, please see plan of care.  Estimated Length of Stay:  DC on 05/13/13  Reason for Continuation of Hospitalization: Mania Medication stabilization Withdrawal symptoms  New Problems/Goals identified:  N/A  Discharge Plan or Barriers:   return home, follow up outpt at the [facility]  Additional Comments:  James Walsh has been coming to groups, programming on unit and engaged with Clinical research associatewriter with regards to his treatment, family involvement and aftercare planning.  He reports he is ready to go home and ready to be a good father to his children.    Attendees:  Signature: Thedore MinsMojeed [name], MD 05/12/2013 10:47 AM   Signature: Richelle Itood North, LCSW 05/12/2013 10:47 AM  Signature: Fransisca KaufmannLaura Davis, NP 05/12/2013 10:47 AM  Signature: Joslyn Devonaroline Beaudry, RN 05/12/2013 10:47 AM  Signature: Liborio NixonPatrice White, RN 05/12/2013 10:47 AM  Signature:  05/12/2013 10:47 AM  Signature:   05/12/2013 10:47 AM  Signature:    Signature:    Signature:    Signature:    Signature:    Signature:      Scribe for Treatment Team:   Mordecai RasmussenHannah Lestat Golob., LCSW  05/12/2013 10:47 AM

## 2013-05-13 LAB — CARBAMAZEPINE LEVEL, TOTAL: CARBAMAZEPINE LVL: 6.2 ug/mL (ref 4.0–12.0)

## 2013-05-13 MED ORDER — CARBAMAZEPINE ER 200 MG PO TB12: 200.0000 mg | ORAL_TABLET | Freq: Two times a day (BID) | ORAL | Status: DC

## 2013-05-13 MED ORDER — TRAZODONE HCL 100 MG PO TABS: 100.0000 mg | ORAL_TABLET | Freq: Every evening | ORAL | Status: DC | PRN

## 2013-05-13 NOTE — Discharge Summary (Signed)
Physician Discharge Summary Note  Patient:  James Walsh is an 28 y.o., male MRN:  [id number] DOB:  May 02, 1985 Patient phone:  [phone] (home)  Patient address:   [address] [place] [address],  Total Time spent with patient: 20 minutes  Date of Admission:  05/07/2013 Date of Discharge: 05/13/13  Reason for Admission: Manic behaviors  Discharge Diagnoses: Principal Problem:   Bipolar disorder, unspecified Active Problems:   Substance induced mood disorder   Opiate abuse, continuous   Cannabis dependence   Psychiatric Specialty Exam: Physical Exam  Psychiatric: He has a normal mood and affect. His speech is normal and behavior is normal. Judgment and thought content normal. Cognition and memory are normal.    Review of Systems  Constitutional: Negative.   HENT: Negative.   Eyes: Negative.   Respiratory: Negative.   Cardiovascular: Negative.   Gastrointestinal: Negative.   Genitourinary: Negative.   Musculoskeletal: Negative.   Skin: Negative.   Neurological: Negative.   Endo/Heme/Allergies: Negative.   Psychiatric/Behavioral: Negative.     Blood pressure 147/82, pulse 114, temperature 97.9 F (36.6 C), temperature source Oral, resp. rate 20, height 5' 7.72" (1.72 m), weight 73.029 kg (161 lb).Body mass index is 24.69 kg/(m^2).  General Appearance: Fairly Groomed  Patent attorney::  Good  Speech:  Clear and Coherent  Volume:  Normal  Mood:  Euthymic  Affect:  Appropriate  Thought Process:  Goal Directed  Orientation:  Full (Time, Place, and Person)  Thought Content:  Negative  Suicidal Thoughts:  No  Homicidal Thoughts:  No  Memory:  Immediate;   Fair Recent;   Fair Remote;   Fair  Judgement:  Fair  Insight:  marginal   Psychomotor Activity:  Normal  Concentration:  Fair  Recall:  [name] of Knowledge:Fair  Language: Good  Akathisia:  No  Handed:  Right  AIMS (if indicated):     Assets:  Communication Skills Physical Health   Sleep:  Number of Hours: 5    Past Psychiatric History: Yes Diagnosis: Bipolar Disorder  Hospitalizations: [facility] 2007  Outpatient Care: None prior   Substance Abuse Care: Denies  Self-Mutilation: Denies  Suicidal Attempts: History of suicidal gestures   Violent Behaviors: Potential    Musculoskeletal: Strength & Muscle Tone: within normal limits Gait & Station: normal Patient leans: N/A  DSM5:  AXIS I: Bipolar disorder, unspecified  Opioid use disorder  Cannabis use disorder  Substance induced mood disorder  AXIS II: Cluster B Traits  AXIS III:  Past Medical History   Diagnosis  Date   .  None reported    AXIS IV: other psychosocial or environmental problems, problems related to social environment and problems with primary support group  AXIS V: 61-70 mild symptoms  Level of Care:  OP  Hospital Course:  James Walsh is a 28 year old male who presented to [facility] under IVC initiated by his mother. The patient has a history of Bipolar Disorder, but has not been taking medications. His admission was prompted by appearing in court for a 50 B hearing that was initiated by his girlfriend. James Walsh felt that this was taken out as a way to prevent him for having custody of his children. He reportedly became angry and threw a flower pot at nobody in particular. Patient states today during his psychiatric assessment "My mother took out papers. We had an argument. It was over money. She did not have to do that. I went to court to get my kids.  They are just playing games with me. My mother tells people that I have Bipolar. But I don't need medications because there is nothing wrong with me. I get angry sometimes. But I think that anyone would have reacted like that. It does not mean that they need medications. I have been using heroine and marijuana. That's no big deal. Somebody told me that a little bit of drugs in my system would not prevent me from getting my kids. I really just want to get  out of here. I don't need to be here." The patient shows very poor insight into his mental health problems. He has been [facility] in the past but does not believe that he has any mental illness. Patient appears to be greatly minimizing his symptoms. He is fixated on being discharged from the hospital.   Patient was admitted to the 400 hall for further assessment and medication management. He was not taking any medications for mental health prior to admission. The patient was started on Tegretol XR 200 mg BID for improved mood stability. Patient had poor insight into his Bipolar Disorder and substance abuse. He did not appreciate how his substance abuse could prevent him from obtaining custody of his children. At first the patient refused the medication that was prescribed to him. After meeting with the treatment team [facility] agreed to take the medication. However, he clearly indicated feeling that there was nothing wrong with his mental health wise. Patient saw no justification for his hospital admission greatly minimizing the events that led him to be under IVC. James Walsh began to demand that he be discharged. He became very irritable and mildly agitated upon finding out his actual discharge date set by the treatment team. Patient told staff that he believed that his mother tries to irritate him on purpose. He attended groups and reported that he learned some useful coping skills. Despite his obvious anger there were no episode of seclusion or restraint. After his tegretol level was found to be therapeutic the patient was found stable to discharge home to stay with his parents. James Walsh was provided with prescriptions and sample medications. He left [facility] in no acute distress with all belongings returned to him. Nursing notes indicate that the patient was very impatient about being discharged. He was irritable during the discharge process.   Consults:  None  Significant  Diagnostic Studies:  Admission labs, Tegretol level  Discharge Vitals:   Blood pressure 147/82, pulse 114, temperature 97.9 F (36.6 C), temperature source Oral, resp. rate 20, height 5' 7.72" (1.72 m), weight 73.029 kg (161 lb). Body mass index is 24.69 kg/(m^2). Lab Results:   Results for orders placed during the hospital encounter of 05/07/13 (from the past 72 hour(s))  CARBAMAZEPINE LEVEL, TOTAL     Status: None   Collection Time    05/13/13  6:15 AM      Result Value Ref Range   Carbamazepine Lvl 6.2  4.0 - 12.0 ug/mL   Comment: [facility]    Physical Findings: AIMS: Facial and Oral Movements Muscles of Facial Expression: None, normal Lips and Perioral Area: None, normal Jaw: None, normal Tongue: None, normal,Extremity Movements Upper (arms, wrists, hands, fingers): None, normal Lower (legs, knees, ankles, toes): None, normal, Trunk Movements Neck, shoulders, hips: None, normal, Overall Severity Severity of abnormal movements (highest score from questions above): None, normal Incapacitation due to abnormal movements: None, normal Patient's awareness of abnormal movements (rate only patient's report):  No Awareness, Dental Status Current problems with teeth and/or dentures?: No Does patient usually wear dentures?: No  CIWA:  CIWA-Ar Total: 0 COWS:  COWS Total Score: 0  Psychiatric Specialty Exam: See Psychiatric Specialty Exam and Suicide Risk Assessment completed by Attending Physician prior to discharge.  Discharge destination:  Home  Is patient on multiple antipsychotic therapies at discharge:  No   Has Patient had three or more failed trials of antipsychotic monotherapy by history:  No  Recommended Plan for Multiple Antipsychotic Therapies: NA     Medication List       Indication   carbamazepine 200 MG 12 hr tablet  Commonly known as:  TEGRETOL XR  Take 1 tablet (200 mg total) by mouth 2 (two) times daily.   Indication:   Manic-Depression     traZODone 100 MG tablet  Commonly known as:  DESYREL  Take 1 tablet (100 mg total) by mouth at bedtime and may repeat dose one time if needed.   Indication:  Trouble Sleeping           Follow-up Information   Follow up with [facility] On 05/13/2013. (Go see Dr. Jeannett Senior [facility].  Walk-in on Wednesday from 10-5pm for your hospital follow-up appointment.  )    Contact information:   [address] [facility] [phone]      Follow-up recommendations:   Activity: as tolerated  Diet: healthy  Tests: Tegretol level: 6.2  Other: patient to keep his after care appointment  Comments:  Take all your medications as prescribed by your mental healthcare provider.  Report any adverse effects and or reactions from your medicines to your outpatient provider promptly.  Patient is instructed and cautioned to not engage in alcohol and or illegal drug use while on prescription medicines.  In the event of worsening symptoms, patient is instructed to call the crisis hotline, 911 and or go to the nearest ED for appropriate evaluation and treatment of symptoms.  Follow-up with your primary care provider for your other medical issues, concerns and or health care needs.   Total Discharge Time:  Greater than 30 minutes.  Signed: Fransisca Kaufmann NP-C 05/13/2013, 9:40 AM  Patient seen, evaluated and I agree with notes by Nurse Practitioner. Thedore Mins, MD

## 2013-05-13 NOTE — Progress Notes (Signed)
[facility] Adult Case Management Discharge Plan :  Will you be returning to the same living situation after discharge: Yes,  home with family At discharge, do you have transportation home?:Yes,  mother will come and pick him up Do you have the ability to pay for your medications:Yes,  no barriers will follow up with [facility]  Release of information consent forms completed and in the chart;  Patient's signature needed at discharge.  Patient to Follow up at: Follow-up Information   Follow up with [facility] On 05/13/2013. (Go see Dr. Jeannett SeniorStephen [facility].  Walk-in on Wednesday from 10-5pm for your hospital follow-up appointment.  )    Contact information:   [address]  [place] [phone]      Patient denies SI/HI:   Yes,  no reports of SI    Safety Planning and Suicide Prevention discussed:  Yes,  see SI note  James Walsh was informed about his upcoming appointment in which he reports he will comply with and follow up for medications.    James CourierHannah N Ilani Walsh 05/13/2013, 10:10 AM

## 2013-05-13 NOTE — Progress Notes (Signed)
Patient ID: James Walsh, male   DOB: November 01, 1985, 28 y.o.   MRN: [id number] Nursing discharge note:  Patient became irritable when he was told his ride was here.  Patient called the secretary a "bitch."  He had wanted to walk out earlier and have his parents pick him up elsewhere. This nurse did not feel comfortable with that so he was informed that he would have to wait for his ride.  Patient has been impatient, pacing the hallways and continuously asking for discharge.  Patient was given all his personal belongings, prescriptions and medication samples.  He will follow up with [facility].  He denies SI/HI/AVH.  Discharge instructions reviewed and patient indicated understanding.  Patient left ambulatory with his parents.

## 2013-05-13 NOTE — Progress Notes (Signed)
Adult Psychoeducational Group Note  Date:  05/13/2013 Time:  1000  Group Topic/Focus:  Crisis Planning:   The purpose of this group is to help patients create a crisis plan for use upon discharge or in the future, as needed.  Participation Level:  Active  Participation Quality:  Appropriate  Affect:  Appropriate  Cognitive:  Oriented  Insight: Improving  Engagement in Group:  Engaged  Modes of Intervention:  Discussion  Additional Comments:  Pt attended group  Antony OdeaGina M Rayshaun Needle 05/13/2013, 10:42 AM

## 2013-05-13 NOTE — [facility] Suicide Risk Assessment (Signed)
   Demographic Factors:  Male, Caucasian, Low socioeconomic status and Unemployed  Total Time spent with patient: 20 minutes  Psychiatric Specialty Exam: Physical Exam  Psychiatric: He has a normal mood and affect. His speech is normal and behavior is normal. Judgment and thought content normal. Cognition and memory are normal.    Review of Systems  Constitutional: Negative.   HENT: Negative.   Eyes: Negative.   Respiratory: Negative.   Cardiovascular: Negative.   Gastrointestinal: Negative.   Genitourinary: Negative.   Musculoskeletal: Negative.   Skin: Negative.   Neurological: Negative.   Endo/Heme/Allergies: Negative.   Psychiatric/Behavioral: Negative.     Blood pressure 147/82, pulse 114, temperature 97.9 F (36.6 C), temperature source Oral, resp. rate 20, height 5' 7.72" (1.72 m), weight 73.029 kg (161 lb).Body mass index is 24.69 kg/(m^2).  General Appearance: Fairly Groomed  Patent attorneyye Contact::  Good  Speech:  Clear and Coherent  Volume:  Normal  Mood:  Euthymic  Affect:  Appropriate  Thought Process:  Goal Directed  Orientation:  Full (Time, Place, and Person)  Thought Content:  Negative  Suicidal Thoughts:  No  Homicidal Thoughts:  No  Memory:  Immediate;   Fair Recent;   Fair Remote;   Fair  Judgement:  Fair  Insight:  marginal  Psychomotor Activity:  Normal  Concentration:  Fair  Recall:  [employer]  Fund of Knowledge:Fair  Language: Good  Akathisia:  No  Handed:  Right  AIMS (if indicated):     Assets:  Communication Skills Physical Health  Sleep:  Number of Hours: 5    Musculoskeletal: Strength & Muscle Tone: within normal limits Gait & Station: normal Patient leans: N/A   Mental Status Per Nursing Assessment::   On Admission:  NA  Current Mental Status by Physician: patient denies suicidal ideation, intent or plan  Loss Factors: Financial problems/change in socioeconomic status  Historical Factors: Impulsivity  Risk Reduction Factors:    Sense of responsibility to family  Continued Clinical Symptoms:  Alcohol/Substance Abuse/Dependencies  Cognitive Features That Contribute To Risk:  Closed-mindedness    Suicide Risk:  Minimal: No identifiable suicidal ideation.  Patients presenting with no risk factors but with morbid ruminations; may be classified as minimal risk based on the severity of the depressive symptoms  Discharge Diagnoses:   AXIS I:  Bipolar disorder, unspecified              Opioid use disorder              Cannabis use disorder              Substance induced mood disorder AXIS II:  Cluster B Traits AXIS III:   Past Medical History  Diagnosis Date  . None reported    AXIS IV:  other psychosocial or environmental problems, problems related to social environment and problems with primary support group AXIS V:  61-70 mild symptoms  Plan Of Care/Follow-up recommendations:  Activity:  as tolerated Diet:  healthy Tests:  Tegretol level: 6.2 Other:  patient to keep his after care appointment  Is patient on multiple antipsychotic therapies at discharge:  No   Has Patient had three or more failed trials of antipsychotic monotherapy by history:  No  Recommended Plan for Multiple Antipsychotic Therapies: NA    Thedore MinsMojeed Shanesha Bednarz, MD 05/13/2013, 10:14 AM

## 2013-05-13 NOTE — [facility] Group Notes (Signed)
[facility] LCSW Aftercare Discharge Planning Group Note   05/13/2013 10:07 AM  Participation Quality:  Active and Engaged  Mood/Affect:  Appropriate  Bright, full smiling affect  Depression Rating:  denied  Anxiety Rating:  denied  Thoughts of Suicide:  No Will you contract for safety?   Yes  Current AVH:  No  Plan for Discharge/Comments:  Patient to be discharged today.  He will be going back home with his parents and they will be picking him up.  Patient has an appointment at [facility] in which he is aware and will follow up.  Transportation Means: mother  Supports: family  Raye SorrowHannah N Oswaldo Cueto

## 2013-05-18 NOTE — Progress Notes (Signed)
Patient Discharge Instructions:  After Visit Summary (AVS):   Faxed to:  05/18/13 Discharge Summary Note:   Faxed to:  05/18/13 Psychiatric Admission Assessment Note:   Faxed to:  05/18/13 Suicide Risk Assessment - Discharge Assessment:   Faxed to:  05/18/13 Faxed/Sent to the Next Level Care provider:  05/18/13 Faxed to [facility] @ [phone]  Jerelene ReddenSheena E [place], 05/18/2013, 4:40 PM

## 2014-05-01 ENCOUNTER — Emergency Department (HOSPITAL_COMMUNITY)
Admission: EM | Admit: 2014-05-01 | Discharge: 2014-05-01 | Disposition: A | Discharge status: Home / Self Care | Payer: Self-pay | Attending: Emergency Medicine | Admitting: Emergency Medicine

## 2014-05-01 ENCOUNTER — Encounter (HOSPITAL_COMMUNITY): Payer: Self-pay | Admitting: *Deleted

## 2014-05-01 DIAGNOSIS — F319 Bipolar disorder, unspecified: Secondary | ICD-10-CM | POA: Insufficient documentation

## 2014-05-01 DIAGNOSIS — Z79899 Other long term (current) drug therapy: Secondary | ICD-10-CM | POA: Insufficient documentation

## 2014-05-01 DIAGNOSIS — Z720 Tobacco use: Secondary | ICD-10-CM | POA: Insufficient documentation

## 2014-05-01 DIAGNOSIS — S51812A Laceration without foreign body of left forearm, initial encounter: Secondary | ICD-10-CM | POA: Insufficient documentation

## 2014-05-01 DIAGNOSIS — Y9339 Activity, other involving climbing, rappelling and jumping off: Secondary | ICD-10-CM | POA: Insufficient documentation

## 2014-05-01 DIAGNOSIS — Y9289 Other specified places as the place of occurrence of the external cause: Secondary | ICD-10-CM | POA: Insufficient documentation

## 2014-05-01 DIAGNOSIS — W231XXA Caught, crushed, jammed, or pinched between stationary objects, initial encounter: Secondary | ICD-10-CM | POA: Insufficient documentation

## 2014-05-01 DIAGNOSIS — F99 Mental disorder, not otherwise specified: Secondary | ICD-10-CM | POA: Insufficient documentation

## 2014-05-01 DIAGNOSIS — Y998 Other external cause status: Secondary | ICD-10-CM | POA: Insufficient documentation

## 2014-05-01 MED ORDER — LIDOCAINE-EPINEPHRINE (PF) 2 %-1:200000 IJ SOLN: 10.0000 mL | Freq: Once | INTRAMUSCULAR | Status: DC

## 2014-05-01 MED ORDER — LIDOCAINE-EPINEPHRINE 2 %-1:100000 IJ SOLN
10.0000 mL | Freq: Once | INTRAMUSCULAR | Status: AC
  Administered 2014-05-01: 10 mL
  Filled 2014-05-01: qty 1

## 2014-05-01 NOTE — ED Notes (Signed)
Pt was jumping off his boat and caught his L forearm on a rod holder and has a puncture/laceration to FA. Injury wrapped by fire, bleeding controlled.

## 2014-05-01 NOTE — ED Notes (Signed)
Bed: WTR9 Expected date: 05/01/14 Expected time: 12:21 PM Means of arrival: Ambulance Comments: Arm lac

## 2014-05-01 NOTE — ED Provider Notes (Signed)
[name]: [id number]     Arrival date & time 05/01/14  1217 History   First MD Initiated Contact with Patient 05/01/14 1221    This chart was scribed for non-physician practitioner, Celene Skeenobyn Eri Mcevers, PA-C working with Jerelyn ScottMartha Linker, MD by Marica OtterNusrat Rahman, ED Scribe. This patient was seen in room WTR9/WTR9 and the patient's care was started at 12:35 PM.  Chief Complaint  Patient presents with  . Extremity Laceration   The history is provided by the patient. No language interpreter was used.   PCP: Julieanne MansonGILBERT, RICHARD, MD HPI Comments: James Walsh is a 29 y.o. male, with PMH noted below including daily tobacco use (0.5 ppd), who presents to the Emergency Department complaining of laceration to the left forearm sustained 1.5 hours ago as pt was jumping off his boat and his left forearm got caught on a rod holder. Pt also complains of associated dull pain to the left arm and rates it a 4 out of 10. Pt denies numbness/tingling of the left arm. Pt confirms his last tetanus shot was within the past 5 years.   Past Medical History  Diagnosis Date  . Mental disorder   . Bipolar disorder    Past Surgical History  Procedure Laterality Date  . Fracture surgery      right elbow   No family history on file. History  Substance Use Topics  . Smoking status: Current Every Day Smoker -- 0.50 packs/day    Types: Cigarettes  . Smokeless tobacco: Not on file  . Alcohol Use: 0.0 oz/week     Comment: occasional    Review of Systems  Constitutional: Negative for fever and chills.  Skin: Positive for wound (puncture wound/laceration on left forearm).  Psychiatric/Behavioral: Negative for confusion.  All other systems reviewed and are negative.     Allergies  Other  Home Medications   Prior to Admission medications   Medication Sig Start Date End Date Taking? Authorizing Provider  ibuprofen (ADVIL,MOTRIN) 200 MG tablet Take 800 mg by mouth every 6 (six) hours as needed.   Yes Historical Provider, MD   OLANZapine (ZYPREXA) 15 MG tablet Take 15 mg by mouth at bedtime.   Yes Historical Provider, MD  sodium chloride (OCEAN) 0.65 % SOLN nasal spray Place 1-2 sprays into both nostrils as needed for congestion.   Yes Historical Provider, MD  carbamazepine (TEGRETOL XR) 200 MG 12 hr tablet Take 1 tablet (200 mg total) by mouth 2 (two) times daily. Patient not taking: Reported on 05/01/2014 05/13/13   Thermon LeylandLaura A Davis, NP  traZODone (DESYREL) 100 MG tablet Take 1 tablet (100 mg total) by mouth at bedtime and may repeat dose one time if needed. Patient not taking: Reported on 05/01/2014 05/13/13   Thermon LeylandLaura A Davis, NP   Triage Vitals: BP 126/90 mmHg  Pulse 86  Temp(Src) 98.3 F (36.8 C) (Oral)  Resp 14  SpO2 95% Physical Exam  Constitutional: He is oriented to person, place, and time. He appears well-developed and well-nourished. No distress.  HENT:  Head: Normocephalic and atraumatic.  Eyes: Conjunctivae and EOM are normal.  Neck: Normal range of motion. Neck supple.  Cardiovascular: Normal rate, regular rhythm and normal heart sounds.   Pulses:      Radial pulses are 2+ on the left side.  Pulmonary/Chest: Effort normal and breath sounds normal.  Musculoskeletal: Normal range of motion. He exhibits no edema.       Arms: Neurological: He is alert and oriented to person, place, and time.  Skin: Skin is warm and dry.  Psychiatric: He has a normal mood and affect. His behavior is normal.  Nursing note and vitals reviewed.    LACERATION REPAIR Performed by: Celene Skeen Consent: Verbal consent obtained. Risks and benefits: risks, benefits and alternatives were discussed Patient identity confirmed: provided demographic data Time out performed prior to procedure Prepped and Draped in normal sterile fashion Wound explored Laceration Location: left forearm Laceration Length: 2 cm  No Foreign Bodies seen or palpated Anesthesia: local infiltration Local anesthetic: lidocaine 2% with  epinephrine Anesthetic total: 5 ml Irrigation method: syringe Amount of cleaning: standard Skin closure: 4-0 prolene Number of sutures or staples: 5 Technique: simple interrupted Patient tolerance: Patient tolerated the procedure well with no immediate complications.  ED Course  Procedures (including critical care time) DIAGNOSTIC STUDIES: Oxygen Saturation is 95% on RA, adequate by my interpretation.    COORDINATION OF CARE:  12:37 PM-Discussed treatment plan which includes laceration repair with pt at bedside and pt agreed to plan.    Labs Review Labs Reviewed - No data to display  Imaging Review No results found.   EKG Interpretation None      MDM   Final diagnoses:  Forearm laceration, left, initial encounter   NAD. Neurovascularly intact. No evidence of tendon disruption. Tetanus up-to-date. Laceration repaired. Wound care given. Stable for discharge. Return precautions given. Patient states understanding of treatment care plan and is agreeable.  I personally performed the services described in this documentation, which was scribed in my presence. The recorded information has been reviewed and is accurate.  Kathrynn Speed, PA-C 05/01/14 1327  Jerelyn Scott, MD 05/01/14 [phone]

## 2014-05-01 NOTE — Discharge Instructions (Signed)
Laceration Care, Adult °A laceration is a cut or lesion that goes through all layers of the skin and into the tissue just beneath the skin. °TREATMENT  °Some lacerations may not require closure. Some lacerations may not be able to be closed due to an increased risk of infection. It is important to see your caregiver as soon as possible after an injury to minimize the risk of infection and maximize the opportunity for successful closure. °If closure is appropriate, pain medicines may be given, if needed. The wound will be cleaned to help prevent infection. Your caregiver will use stitches (sutures), staples, wound glue (adhesive), or skin adhesive strips to repair the laceration. These tools bring the skin edges together to allow for faster healing and a better cosmetic outcome. However, all wounds will heal with a scar. Once the wound has healed, scarring can be minimized by covering the wound with sunscreen during the day for 1 full year. °HOME CARE INSTRUCTIONS  °For sutures or staples: °· Keep the wound clean and dry. °· If you were given a bandage (dressing), you should change it at least once a day. Also, change the dressing if it becomes wet or dirty, or as directed by your caregiver. °· Wash the wound with soap and water 2 times a day. Rinse the wound off with water to remove all soap. Pat the wound dry with a clean towel. °· After cleaning, apply a thin layer of the antibiotic ointment as recommended by your caregiver. This will help prevent infection and keep the dressing from sticking. °· You may shower as usual after the first 24 hours. Do not soak the wound in water until the sutures are removed. °· Only take over-the-counter or prescription medicines for pain, discomfort, or fever as directed by your caregiver. °· Get your sutures or staples removed as directed by your caregiver. °For skin adhesive strips: °· Keep the wound clean and dry. °· Do not get the skin adhesive strips wet. You may bathe  carefully, using caution to keep the wound dry. °· If the wound gets wet, pat it dry with a clean towel. °· Skin adhesive strips will fall off on their own. You may trim the strips as the wound heals. Do not remove skin adhesive strips that are still stuck to the wound. They will fall off in time. °For wound adhesive: °· You may briefly wet your wound in the shower or bath. Do not soak or scrub the wound. Do not swim. Avoid periods of heavy perspiration until the skin adhesive has fallen off on its own. After showering or bathing, gently pat the wound dry with a clean towel. °· Do not apply liquid medicine, cream medicine, or ointment medicine to your wound while the skin adhesive is in place. This may loosen the film before your wound is healed. °· If a dressing is placed over the wound, be careful not to apply tape directly over the skin adhesive. This may cause the adhesive to be pulled off before the wound is healed. °· Avoid prolonged exposure to sunlight or tanning lamps while the skin adhesive is in place. Exposure to ultraviolet light in the first year will darken the scar. °· The skin adhesive will usually remain in place for 5 to 10 days, then naturally fall off the skin. Do not pick at the adhesive film. °You may need a tetanus shot if: °· You cannot remember when you had your last tetanus shot. °· You have never had a tetanus   shot. If you get a tetanus shot, your arm may swell, get red, and feel warm to the touch. This is common and not a problem. If you need a tetanus shot and you choose not to have one, there is a rare chance of getting tetanus. Sickness from tetanus can be serious. SEEK MEDICAL CARE IF:   You have redness, swelling, or increasing pain in the wound.  You see a red line that goes away from the wound.  You have yellowish-white fluid (pus) coming from the wound.  You have a fever.  You notice a bad smell coming from the wound or dressing.  Your wound breaks open before or  after sutures have been removed.  You notice something coming out of the wound such as wood or glass.  Your wound is on your hand or foot and you cannot move a finger or toe. SEEK IMMEDIATE MEDICAL CARE IF:   Your pain is not controlled with prescribed medicine.  You have severe swelling around the wound causing pain and numbness or a change in color in your arm, hand, leg, or foot.  Your wound splits open and starts bleeding.  You have worsening numbness, weakness, or loss of function of any joint around or beyond the wound.  You develop painful lumps near the wound or on the skin anywhere on your body. MAKE SURE YOU:   Understand these instructions.  Will watch your condition.  Will get help right away if you are not doing well or get worse. Document Released: 01/22/2005 Document Revised: 04/16/2011 Document Reviewed: 07/18/2010 [facility] Patient Information 2015 [place], [place]. This information is not intended to replace advice given to you by your health care provider. Make sure you discuss any questions you have with your health care provider.  Stitches, Staples, or Skin Adhesive Strips  Stitches (sutures), staples, and skin adhesive strips hold the skin together as it heals. They will usually be in place for 7 days or less. HOME CARE  Wash your hands with soap and water before and after you touch your wound.  Only take medicine as told by your doctor.  Cover your wound only if your doctor told you to. Otherwise, leave it open to air.  Do not get your stitches wet or dirty. If they get dirty, dab them gently with a clean washcloth. Wet the washcloth with soapy water. Do not rub. Pat them dry gently.  Do not put medicine or medicated cream on your stitches unless your doctor told you to.  Do not take out your own stitches or staples. Skin adhesive strips will fall off by themselves.  Do not pick at the wound. Picking can cause an infection.  Do not miss your follow-up  appointment.  If you have problems or questions, call your doctor. GET HELP RIGHT AWAY IF:   You have a temperature by mouth above 102 F (38.9 C), not controlled by medicine.  You have chills.  You have redness or pain around your stitches.  There is puffiness (swelling) around your stitches.  You notice fluid (drainage) from your stitches.  There is a bad smell coming from your wound. MAKE SURE YOU:  Understand these instructions.  Will watch your condition.  Will get help if you are not doing well or get worse. Document Released: 11/19/2008 Document Revised: 04/16/2011 Document Reviewed: 11/19/2008 [facility] Patient Information 2015 [place], [place]. This information is not intended to replace advice given to you by your health care provider. Make sure you discuss  any questions you have with your health care provider. ° °

## 2014-05-30 NOTE — Op Note (Signed)
PATIENT NAME:  James Walsh, James Walsh MR#:  [id number] DATE OF BIRTH:  1985/03/03  DATE OF PROCEDURE:  01/31/2011  PREOPERATIVE DIAGNOSIS: Mass/lesion with a question of vascular malformation left flank/back.  POSTOPERATIVE DIAGNOSIS: Mass/lesion with a question of vascular malformation left flank/back.  PROCEDURE: Excision of mass left flank/back.   SURGEON: Annice NeedyJason S. Ebert Forrester, MD   ANESTHESIA: General.   ESTIMATED BLOOD LOSS: 20 to 25 mL.   INDICATION FOR PROCEDURE: This is a 29 year old white male who presented to my office for a painful mass in his left back. He had an MRI performed which showed a significant vascular component. This was symptomatic and he desired to have it removed. It did not appear to have any connection to the deep vascular structures or the spinal cord region and so resection was reasonable. Risks and benefits were discussed. Informed consent was obtained.   DESCRIPTION OF PROCEDURE: The patient was brought to the operative suite and after an adequate level of general anesthesia was obtained he was placed in the prone position. A transverse to oblique incision was created overlying the palpable mass. We dissected down. This was a fatty amalgam of tissue which was soft. There were a significant number of small to medium size veins and arteries but no dominate or blood vessels were particularly worrisome. This tracked out laterally more than could be palpable externally and so excision was extended laterally to dissect this out. Several small vessels were treated with either electrocautery or silk ties during our dissection. The mass was then removed. It was marked for orientation with the long suture lateral and short suture superior. The wound was irrigated. Hemostasis was achieved. I then closed the deep layer with four interrupted 2-0 Vicryl sutures. The subcutaneous tissue was closed with a running 3-0 Vicryl and the skin was closed with 4-0 Monocryl. Dermabond was placed as a  dressing.    The patient tolerated the procedure well and was taken to the recovery room in stable condition.   ____________________________ Annice NeedyJason S. Laree Garron, MD jsd:drc D: 01/31/2011 09:20:56 ET T: 01/31/2011 12:17:40 ET JOB#: [id number]  cc: Annice NeedyJason S. Mickle Campton, MD, <Dictator> Richard L. Sullivan LoneGilbert, MD Annice NeedyJASON S Yaniel Limbaugh MD ELECTRONICALLY SIGNED 02/23/2011 7:53

## 2014-08-13 ENCOUNTER — Emergency Department (HOSPITAL_COMMUNITY)
Admission: EM | Admit: 2014-08-13 | Discharge: 2014-08-15 | Disposition: A | Discharge status: Other Acute Inpatient Hospital | Payer: Federal, State, Local not specified - Other | Attending: Emergency Medicine | Admitting: Emergency Medicine

## 2014-08-13 ENCOUNTER — Encounter (HOSPITAL_COMMUNITY): Payer: Self-pay | Admitting: Emergency Medicine

## 2014-08-13 DIAGNOSIS — F1994 Other psychoactive substance use, unspecified with psychoactive substance-induced mood disorder: Secondary | ICD-10-CM

## 2014-08-13 DIAGNOSIS — F3160 Bipolar disorder, current episode mixed, unspecified: Secondary | ICD-10-CM | POA: Diagnosis present

## 2014-08-13 DIAGNOSIS — F309 Manic episode, unspecified: Secondary | ICD-10-CM | POA: Insufficient documentation

## 2014-08-13 DIAGNOSIS — F1910 Other psychoactive substance abuse, uncomplicated: Secondary | ICD-10-CM

## 2014-08-13 DIAGNOSIS — Z79899 Other long term (current) drug therapy: Secondary | ICD-10-CM | POA: Insufficient documentation

## 2014-08-13 DIAGNOSIS — Z720 Tobacco use: Secondary | ICD-10-CM | POA: Insufficient documentation

## 2014-08-13 DIAGNOSIS — F1514 Other stimulant abuse with stimulant-induced mood disorder: Secondary | ICD-10-CM | POA: Insufficient documentation

## 2014-08-13 DIAGNOSIS — F1114 Opioid abuse with opioid-induced mood disorder: Secondary | ICD-10-CM | POA: Insufficient documentation

## 2014-08-13 DIAGNOSIS — F1314 Sedative, hypnotic or anxiolytic abuse with sedative, hypnotic or anxiolytic-induced mood disorder: Secondary | ICD-10-CM | POA: Insufficient documentation

## 2014-08-13 NOTE — ED Notes (Signed)
Patient states his mom took out papers because she thinks he needs to take more medication. Patient states he lives alone. Patient denies SI/HI. Patient states he is taking Xyprexa currently. Patient states he also takes Klonopin when needed. Patient states he sometimes will purchase Adderall off the street. 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 Patient is cooperative, alert and oriented, NAD noted. Denies A/V hallucinations.

## 2014-08-13 NOTE — ED Notes (Signed)
Bed: WTR6 Expected date:  Expected time:  Means of arrival:  Comments: EMS/[facility] pt/IVC

## 2014-08-13 NOTE — ED Notes (Signed)
Per EMS, [facility] (by his parents), patient using "street" suboxin 8mg  daily. [facility] sending patient to our facility as they "don't have any Benzo's" to be able treat with. Patient unpleasant but cooperative with EMS. Per IVC paperwork patient is suicidal.

## 2014-08-14 DIAGNOSIS — F3160 Bipolar disorder, current episode mixed, unspecified: Secondary | ICD-10-CM

## 2014-08-14 DIAGNOSIS — F1910 Other psychoactive substance abuse, uncomplicated: Secondary | ICD-10-CM | POA: Insufficient documentation

## 2014-08-14 LAB — CBC
HCT: 44.6 % (ref 39.0–52.0)
Hemoglobin: 14.7 g/dL (ref 13.0–17.0)
MCH: 29.5 pg (ref 26.0–34.0)
MCHC: 33 g/dL (ref 30.0–36.0)
MCV: 89.6 fL (ref 78.0–100.0)
Platelets: 233 10*3/uL (ref 150–400)
RBC: 4.98 MIL/uL (ref 4.22–5.81)
RDW: 13.9 % (ref 11.5–15.5)
WBC: 9.3 10*3/uL (ref 4.0–10.5)

## 2014-08-14 LAB — ETHANOL: Alcohol, Ethyl (B): <5 mg/dL (ref <5)

## 2014-08-14 LAB — COMPREHENSIVE METABOLIC PANEL
ALT: 280 U/L — ABNORMAL HIGH (ref 17–63)
AST: 122 U/L — ABNORMAL HIGH (ref 15–41)
Albumin: 4.5 g/dL (ref 3.5–5.0)
Alkaline Phosphatase: 60 U/L (ref 38–126)
Anion gap: 6 (ref 5–15)
BILIRUBIN TOTAL: 0.4 mg/dL (ref 0.3–1.2)
BUN: 15 mg/dL (ref 6–20)
CALCIUM: 9 mg/dL (ref 8.9–10.3)
CO2: 29 mmol/L (ref 22–32)
Chloride: 105 mmol/L (ref 101–111)
Creatinine, Ser: 0.95 mg/dL (ref 0.61–1.24)
GFR calc non Af Amer: >60 mL/min (ref >60)
GLUCOSE: 109 mg/dL — AB (ref 65–99)
Potassium: 4.2 mmol/L (ref 3.5–5.1)
Sodium: 140 mmol/L (ref 135–145)
Total Protein: 7.5 g/dL (ref 6.5–8.1)

## 2014-08-14 LAB — ACETAMINOPHEN LEVEL: Acetaminophen (Tylenol), Serum: <10 ug/mL — ABNORMAL LOW (ref 10–30)

## 2014-08-14 LAB — RAPID URINE DRUG SCREEN, HOSP PERFORMED
AMPHETAMINES: POSITIVE — AB
BARBITURATES: POSITIVE — AB
Benzodiazepines: POSITIVE — AB
Cocaine: NOT DETECTED
OPIATES: POSITIVE — AB
Tetrahydrocannabinol: NOT DETECTED

## 2014-08-14 LAB — SALICYLATE LEVEL: Salicylate Lvl: <4 mg/dL (ref 2.8–30.0)

## 2014-08-14 MED ORDER — ACETAMINOPHEN 325 MG PO TABS: 650.0000 mg | ORAL_TABLET | ORAL | Status: DC | PRN

## 2014-08-14 MED ORDER — ALUM & MAG HYDROXIDE-SIMETH 200-200-20 MG/5ML PO SUSP: 30.0000 mL | ORAL | Status: DC | PRN

## 2014-08-14 MED ORDER — OLANZAPINE 5 MG PO TBDP
15.0000 mg | ORAL_TABLET | Freq: Every day | ORAL | Status: DC
  Administered 2014-08-14: 15 mg via ORAL
  Filled 2014-08-14 (×2): qty 1

## 2014-08-14 MED ORDER — TRAZODONE HCL 100 MG PO TABS: 100.0000 mg | ORAL_TABLET | Freq: Every day | ORAL | Status: DC

## 2014-08-14 MED ORDER — NICOTINE 21 MG/24HR TD PT24
21.0000 mg | MEDICATED_PATCH | Freq: Every day | TRANSDERMAL | Status: DC
  Administered 2014-08-14 – 2014-08-15 (×2): 21 mg via TRANSDERMAL
  Filled 2014-08-14 (×2): qty 1

## 2014-08-14 MED ORDER — HYDROXYZINE HCL 25 MG PO TABS
25.0000 mg | ORAL_TABLET | Freq: Four times a day (QID) | ORAL | Status: DC | PRN
  Administered 2014-08-14: 25 mg via ORAL
  Filled 2014-08-14 (×2): qty 1

## 2014-08-14 MED ORDER — ONDANSETRON HCL 4 MG PO TABS: 4.0000 mg | ORAL_TABLET | Freq: Three times a day (TID) | ORAL | Status: DC | PRN

## 2014-08-14 MED ORDER — ZOLPIDEM TARTRATE 5 MG PO TABS
5.0000 mg | ORAL_TABLET | Freq: Every evening | ORAL | Status: DC | PRN
  Administered 2014-08-14: 5 mg via ORAL
  Filled 2014-08-14: qty 1

## 2014-08-14 MED ORDER — TRAZODONE HCL 50 MG PO TABS
50.0000 mg | ORAL_TABLET | Freq: Every day | ORAL | Status: DC
  Administered 2014-08-14: 50 mg via ORAL
  Filled 2014-08-14: qty 1

## 2014-08-14 MED ORDER — IBUPROFEN 200 MG PO TABS: 600.0000 mg | ORAL_TABLET | Freq: Three times a day (TID) | ORAL | Status: DC | PRN
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
## 2014-08-14 NOTE — B[facility] Counselor (Signed)
TC for collateral info to mom [name] [phone]. Writer left voicemail for mom with request mom call back.  Evette Cristalaroline Paige Onia Shiflett, [place] Therapeutic Triage Specialist

## 2014-08-14 NOTE — ED Notes (Signed)
Patient restless and is pacing halls on unit. Pt fidgety and wringing his hands. PRN medications given for anxiety and sleep. Pt compliant with medications and assessment. No distress noted. Pt went to bed immediately after medications administration. Pt denies SI/HI or hallucinations at this time.

## 2014-08-14 NOTE — ED Notes (Signed)
Pt refused Vistaril for anxiety.
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
## 2014-08-14 NOTE — ED Notes (Signed)
Security called to wand patient and belongings.  

## 2014-08-14 NOTE — BH Assessment (Addendum)
Reviewed ED notes prior to initiating assessment. [facility] due to concerns that the could not handle his opiate w/d sx. Pt was placed under IVC by his mother due to concerns about asking for a gun and SA. [facility].   Requested cart to be placed with pt for assessment, and IVC paperwork to be faxed to [id number].  RN in with pt as he has just arrived to the [facility]. Cart to be placed when RN is finished. This Clinical research associatewriter can be reached at [phone].  Assessment to commence shortly.   Tech called this Clinical research associatewriter and informed that pt would like to take shower prior to speaking with someone. [facility] now [facility] pt when he is done with shower.   Clista BernhardtNancy Aster Eckrich, [facility] Triage Specialist 08/14/2014 1:14 AM

## 2014-08-14 NOTE — ED Notes (Signed)
Pt AAO x 3, no distress noted, presents with SI plan to use a gun as per mother.  Mother took out IVC papers on pt.  Denies feeling hopeless.  Pt reports diagnosed with Bipolar DO.  Monitoring for safety, Q 15 min checks in effect.

## 2014-08-14 NOTE — BH Assessment (Signed)
Tele Assessment Note   James Walsh is an 29 y.o. male presenting to [facility] after being petitioned by his mother. Pt stated "my mother took out papers again". "We didn't get into an argument or anything". "I don't even live with her". "I live by myself". "I stayed at my house and she's done this".  Pt denies SI, HI and AVH at this time. Pt did not report any previous suicide attempts or self-injurious behaviors. Pt reported a family history of suicide and shared that his uncle committed suicide. Pt reported his sleep and appetite has been inconsistent; however for the past week he did not have any issues. Pt reported that he gets 5 or 6 hours of sleep and reported that whenever he takes Zyprexa it helps with his appetite. Pt reported that not getting along with his mother is a stressor. Pt reported that he owns several guns but does not have access to them due to being a felon. Pt shared that he asked for his guns back so that his friend could sell them. Pt reported that he did not damage his mother's car. Pt did not report any pending criminal charges or upcoming court dates. Pt denied any alcohol use but shared that he has been taking saboxone. Pt reported that he does not have prescription for it and shared that he does not take it daily. Pt also reported that he used heroin for 1 year prior to getting on Suboxone. Pt has had multiple psychiatric hospitalizations. Pt reported that he is currently not receiving any mental health treatment but shared that he would like to be referred to a psychiatrist. Pt did not report any physical, sexual or emotional abuse at this time.  Collateral information has been gathered from pt's mother who reported that pt has gotten so paranoid. She reported that pt believes that the house is bugged and there are cameras all over. She also shared that pt will cry out that he can't take it anymore. She stated that pt will also say to her "you're my mom why do you let them do  this to me". She reported that pt threw a cellphone in the woods because he thought it was bugged. She shared that pt messed up her car and has placed several dents in it. She reported that pt has a history of bipolar and he is severely depressed. She shared that pt has been off of his medication and has recently started back taking Zyprexa. She reported that they (her and her husband) have left their home in the past due to pt's behaviors. She reported that pt will stay up all night because he is scared to sleep but he will sleep during the day. She expressed some concern about pt's safety and shared that if pt does not get help she is concern that he may hurt someone or someone may hurt him. She reported that pt has been cursing out others on the telephone and has started a fight with is friend.   Axis I: Bipolar, mixed  Past Medical History:  Past Medical History  Diagnosis Date  . Mental disorder   . Bipolar disorder     Past Surgical History  Procedure Laterality Date  . Fracture surgery      right elbow  . Eye surgery Right     metal plate     Family History: History reviewed. No pertinent family history.  Social History:  reports that he has been smoking Cigarettes.  He has  been smoking about 1.00 pack per day. He does not have any smokeless tobacco history on file. He reports that he uses illicit drugs. He reports that he does not drink alcohol.  Additional Social History:  Alcohol / Drug Use History of alcohol / drug use?:  (UDS is positive for Opiates, Benzodiazepines, Amphetamines and Barbiturates. ) Substance #1 Name of Substance 1: Heroin  1 - Age of First Use: 27 1 - Amount (size/oz): 1/2 gram  1 - Frequency: daily  1 - Duration: 1 year  1 - Last Use / Amount: 2015 Substance #2 Name of Substance 2: Suboxone  2 - Frequency: unknown  2 - Duration: ongoing  2 - Last Use / Amount: 08-12-14  CIWA: CIWA-Ar BP: 112/74 mmHg Pulse Rate: 78 COWS:    PATIENT STRENGTHS:  (choose at least two) Average or above average intelligence Capable of independent living  Allergies: No Known Allergies  Home Medications:  (Not in a hospital admission)  OB/GYN Status:  No LMP for male patient.  General Assessment Data [facility] of Assessment: [facility] TTS Assessment: In system Is this a Tele or Face-to-Face Assessment?: Face-to-Face Is this an Initial Assessment or a Re-assessment for this encounter?: Initial Assessment Marital status: Single Living Arrangements: Alone Can pt return to current living arrangement?: Yes Admission Status: Involuntary Is patient capable of signing voluntary admission?: Yes Referral Source: Self/Family/Friend Insurance type: None      Crisis Care Plan Living Arrangements: Alone Name of Psychiatrist: No provider reported at this time.  Name of Therapist: No provider reported at this time   Education Status Is patient currently in school?: No Current Grade: NA Highest grade of school patient has completed: NA Name of school: NA Contact person: NA  Risk to self with the past 6 months Suicidal Ideation: No Has patient been a risk to self within the past 6 months prior to admission? : No Suicidal Intent: No Has patient had any suicidal intent within the past 6 months prior to admission? : No Is patient at risk for suicide?: No Suicidal Plan?: No Has patient had any suicidal plan within the past 6 months prior to admission? : No Access to Means: No What has been your use of drugs/alcohol within the last 12 months?: Pt reported a p Previous Attempts/Gestures: No How many times?: 0 Other Self Harm Risks: No other self harm risk identified at this time.  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 : No Thoughts of Harm to Others: No Current Homicidal Intent: No Current Homicidal Plan: No Access to Homicidal Means: No Identified Victim: NA History of harm to others?: No Assessment of Violence: On admission Violent Behavior Description: No violent behaviors observed. Pt is calm and cooperative at this time.  Does patient have access to weapons?: Yes (Comment) (Pt rpted that he own several guns but does not have access 2) Criminal Charges Pending?: No Does patient have a court date: No Is patient on probation?: No  Psychosis Hallucinations: None noted Delusions: None noted  Mental  Status Report Appearance/Hygiene: In scrubs Eye Contact: Fair Motor Activity: Freedom of movement Speech: Logical/coherent Level of Consciousness: Alert Mood: Euthymic, Pleasant Affect: Appropriate to circumstance Anxiety Level: Minimal Thought Processes: Coherent, Relevant Judgement: Unimpaired Orientation: Person, Place, Time, Situation Obsessive Compulsive Thoughts/Behaviors: None  Cognitive Functioning Concentration: Normal Memory: Recent Intact, Remote Intact IQ: Average Insight: Fair Impulse Control: Fair Appetite: Good Weight Loss: 0 Weight Gain: 0 Sleep: No Change Total Hours of Sleep: 6 Vegetative Symptoms: Staying in bed  ADLScreening [facility] Assessment Services) Patient's cognitive ability adequate to safely complete daily activities?: Yes Patient able to express need for assistance with ADLs?: Yes Independently performs ADLs?: Yes (appropriate for developmental age)  Prior Inpatient Therapy Prior Inpatient  Therapy: Yes Prior Therapy Dates: 2003, 2007, 2015 [facility]  Reason for Treatment: bipolar, substance induced mood disorder   Prior Outpatient Therapy Prior Outpatient Therapy: No Does patient have an ACCT team?: No Does patient have Intensive In-House Services?  : No Does patient have [facility] services? : No Does patient have P4CC services?: No  ADL Screening (condition at time of admission) Patient's cognitive ability adequate to safely complete daily activities?: Yes Is the patient deaf or have difficulty hearing?: No Does the patient have difficulty seeing, even when wearing glasses/contacts?: No Does the patient have difficulty concentrating, remembering, or making decisions?: No Patient able to express need for assistance with ADLs?: Yes Does the patient have difficulty dressing or bathing?: No Independently performs ADLs?: Yes (appropriate for developmental age)       Abuse/Neglect Assessment (Assessment to be complete while patient is alone) Physical Abuse: Denies Verbal Abuse: Denies Sexual Abuse: Denies Exploitation of patient/patient's resources: Denies Self-Neglect: Denies     Merchant navy officerAdvance Directives (For Healthcare) Does patient have an advance directive?: No Would patient like information on creating an advanced directive?: No - patient declined information    Additional Information 1:1 In Past 12 Months?: No CIRT Risk: No Elopement Risk: No Does patient have medical clearance?: Yes     Disposition:  Disposition Initial Assessment Completed for this Encounter: Yes Disposition of Patient: Inpatient treatment program Type of inpatient treatment program: Adult  Oseph Imburgia S 08/14/2014 2:49 AM

## 2014-08-14 NOTE — Consult Note (Signed)
[place] [facility]iat[facility] Consult   Reason for Consult:  Bipolar disorder Mixed Referring Physician:  EDP Patient Identification: James Walsh MRN:  [id number] Principal Diagnosis: Bipolar affective, mixed Diagnosis:   Patient Active Problem List   Diagnosis Date Noted  . Bipolar affective, mixed [F31.60] 08/14/2014    Priority: High  . Substance induced mood disorder [F19.94] 05/07/2013  . Bipolar disorder, unspecified [F31.9] 05/07/2013  . Opiate abuse, continuous [F11.10] 05/07/2013  . Cannabis dependence [F12.20] 05/07/2013    Total Time spent with patient: 45 minutes  Subjective:   James Walsh is a 29 y.o. male patient admitted with Bipolar disorder, Mixed.  HPI: Caucasian male, 29 years old was evaluated for Polysubstance abuse, suicidal gesture by demanding his gun be given to him.  Patient was IVC by his mother.   Mother  also stated that patient is using illicit drugs and have been threatening the family after using.   Patient today denied every accusation by his mother and stated that he only asked for his gun to be given to him because it belong to him.  Patient admitted to a hx of Bipolar disorder and anxiety and states he sees a provider  for his medications.   He reports he took his medication Zyprexa Thursday night.  He has been hospitalized at [facility] last year for substance abuse.  Patient repeatedly stated that his mother lied about his [place] and brought him to the hospital.  Patient reports that he has a prescription for his Klonopin but admits to buying Suboxone, Adderall from the street and uses Marijuana.  We will keep patient overnight and reevaluate in am.  HPI Elements:   Location:  Substance induced mood disorder, Bipolar disorder, mixed, . Quality:  severe-moderate. Severity:  Severe-moderate. Timing:  Acute. Duration:  Chronic mental illness. Context:  IVC by his mother for aggression and agitataion and for asking for his gun..  Past Medical History:   Past Medical History  Diagnosis Date  . Mental disorder   . Bipolar disorder     Past Surgical History  Procedure Laterality Date  . Fracture surgery      right elbow  . Eye surgery Right     metal plate    Family History: History reviewed. No pertinent family history. Social History:  History  Alcohol Use No    Comment: occasional     History  Drug Use  . Yes    Comment: 08/13/2014 denies drugs, uses pills mentioned in traige note __ denies 05/01/14 --(05/2013)admitts to using methadone this am took 2 $Remo'10mg'vIpyh$   dose of meth., xnanx 1/$RemoveBefore'2mg'RNolDMmOiSGKu$  last time used was 1-2weeks.    History   Social History  . Marital Status: Married    Spouse Name: N/A  . Number of Children: N/A  . Years of Education: N/A   Social History Main Topics  . Smoking status: Current Every Day Smoker -- 1.00 packs/day    Types: Cigarettes  . Smokeless tobacco: Not on file  . Alcohol Use: No     Comment: occasional  . Drug Use: Yes     Comment: 08/13/2014 denies drugs, uses pills mentioned in traige note __ denies 05/01/14 --(05/2013)admitts to using methadone this am took 2 $Remo'10mg'kVcWn$   dose of meth., xnanx 1/$RemoveBefore'2mg'rERKswTgCKjDg$  last time used was 1-2weeks.  Marland Kitchen Sexual Activity: Not on file   Other Topics Concern  . None   Social History Narrative   Additional Social History:    History of alcohol / drug use?:  (  UDS is positive for Opiates, Benzodiazepines, Amphetamines and Barbiturates. ) Name of Substance 1: Heroin  1 - Age of First Use: 27 1 - Amount (size/oz): 1/2 gram  1 - Frequency: daily  1 - Duration: 1 year  1 - Last Use / Amount: 2015 Name of Substance 2: Suboxone  2 - Frequency: unknown  2 - Duration: ongoing  2 - Last Use / Amount: 08-12-14                 Allergies:  No Known Allergies  Labs:  Results for orders placed or performed during the hospital encounter of 08/13/14 (from the past 48 hour(s))  Urine rapid drug screen (hosp performed)not at [facility]     Status: Abnormal   Collection Time: 08/14/14  12:05 AM  Result Value Ref Range   Opiates POSITIVE (A) NONE DETECTED   Cocaine NONE DETECTED NONE DETECTED   Benzodiazepines POSITIVE (A) NONE DETECTED   Amphetamines POSITIVE (A) NONE DETECTED   Tetrahydrocannabinol NONE DETECTED NONE DETECTED   Barbiturates POSITIVE (A) NONE DETECTED    Comment:        DRUG SCREEN FOR MEDICAL PURPOSES ONLY.  IF CONFIRMATION IS NEEDED FOR ANY PURPOSE, NOTIFY LAB WITHIN 5 DAYS.        LOWEST DETECTABLE LIMITS FOR URINE DRUG SCREEN Drug Class       Cutoff (ng/mL) Amphetamine      1000 Barbiturate      200 Benzodiazepine   856 Tricyclics       314 Opiates          300 Cocaine          300 THC              50   Acetaminophen level     Status: Abnormal   Collection Time: 08/14/14 12:11 AM  Result Value Ref Range   Acetaminophen (Tylenol), Serum <10 (L) 10 - 30 ug/mL    Comment:        THERAPEUTIC CONCENTRATIONS VARY SIGNIFICANTLY. A RANGE OF 10-30 ug/mL MAY BE AN EFFECTIVE CONCENTRATION FOR MANY PATIENTS. HOWEVER, SOME ARE BEST TREATED AT CONCENTRATIONS OUTSIDE THIS RANGE. ACETAMINOPHEN CONCENTRATIONS >150 ug/mL AT 4 HOURS AFTER INGESTION AND >50 ug/mL AT 12 HOURS AFTER INGESTION ARE OFTEN ASSOCIATED WITH TOXIC REACTIONS.   CBC     Status: None   Collection Time: 08/14/14 12:11 AM  Result Value Ref Range   WBC 9.3 4.0 - 10.5 K/uL   RBC 4.98 4.22 - 5.81 MIL/uL   Hemoglobin 14.7 13.0 - 17.0 g/dL   HCT 44.6 39.0 - 52.0 %   MCV 89.6 78.0 - 100.0 fL   MCH 29.5 26.0 - 34.0 pg   MCHC 33.0 30.0 - 36.0 g/dL   RDW 13.9 11.5 - 15.5 %   Platelets 233 150 - 400 K/uL  Comprehensive metabolic panel     Status: Abnormal   Collection Time: 08/14/14 12:11 AM  Result Value Ref Range   Sodium 140 135 - 145 mmol/L   Potassium 4.2 3.5 - 5.1 mmol/L   Chloride 105 101 - 111 mmol/L   CO2 29 22 - 32 mmol/L   Glucose, Bld 109 (H) 65 - 99 mg/dL   BUN 15 6 - 20 mg/dL   Creatinine, Ser 0.95 0.61 - 1.24 mg/dL   Calcium 9.0 8.9 - 10.3 mg/dL   Total  Protein 7.5 6.5 - 8.1 g/dL   Albumin 4.5 3.5 - 5.0 g/dL   AST 122 (H) 15 -  41 U/L   ALT 280 (H) 17 - 63 U/L   Alkaline Phosphatase 60 38 - 126 U/L   Total Bilirubin 0.4 0.3 - 1.2 mg/dL   GFR calc non Af Amer >60 >60 mL/min   GFR calc Af Amer >60 >60 mL/min    Comment: (NOTE) The eGFR has been calculated using the CKD EPI equation. This calculation has not been validated in all clinical situations. eGFR's persistently <60 mL/min signify possible Chronic Kidney Disease.    Anion gap 6 5 - 15  Ethanol (ETOH)     Status: None   Collection Time: 08/14/14 12:11 AM  Result Value Ref Range   Alcohol, Ethyl (B) <5 <5 mg/dL    Comment:        LOWEST DETECTABLE LIMIT FOR SERUM ALCOHOL IS 5 mg/dL FOR MEDICAL PURPOSES ONLY   Salicylate level     Status: None   Collection Time: 08/14/14 12:11 AM  Result Value Ref Range   Salicylate Lvl <3.7 2.8 - 30.0 mg/dL    Vitals: Blood pressure 114/74, pulse 85, temperature 97.3 F (36.3 C), temperature source Oral, resp. rate 18, SpO2 100 %.  Risk to Self: Suicidal Ideation: No Suicidal Intent: No Is patient at risk for suicide?: No Suicidal Plan?: No Access to Means: No What has been your use of drugs/alcohol within the last 12 months?: Pt reported a p How many times?: 0 Other Self Harm Risks: No other self harm risk identified at this time.  Triggers for Past Attempts: None known Intentional Self Injurious Behavior: None Risk to Others: Homicidal Ideation: No Thoughts of Harm to Others: No Current Homicidal Intent: No Current Homicidal Plan: No Access to Homicidal Means: No Identified Victim: NA History of harm to others?: No Assessment of Violence: On admission Violent Behavior Description: No violent behaviors observed. Pt is calm and cooperative at this time.  Does patient have access to weapons?: Yes (Comment) (Pt rpted that he own several guns but does not have access 2) Criminal Charges Pending?: No Does patient have a court  date: No Prior Inpatient Therapy: Prior Inpatient Therapy: Yes Prior Therapy Dates: 2003, 2007, 2015 Prior Therapy Facilty/Provider(s): [facility]  Reason for Treatment: bipolar, substance induced mood disorder  Prior Outpatient Therapy: Prior Outpatient Therapy: No Does patient have an ACCT team?: No Does patient have Intensive In-House Services?  : No Does patient have [facility] services? : No Does patient have P4CC services?: No  Current Facility-Administered Medications  Medication Dose Route Frequency Provider Last Rate Last Dose  . acetaminophen (TYLENOL) tablet 650 mg  650 mg Oral Q4H PRN Linton Flemings, MD      . alum & mag hydroxide-simeth (MAALOX/MYLANTA) 200-200-20 MG/5ML suspension 30 mL  30 mL Oral PRN Linton Flemings, MD      . hydrOXYzine (ATARAX/VISTARIL) tablet 25 mg  25 mg Oral Q6H PRN Linton Flemings, MD      . ibuprofen (ADVIL,MOTRIN) tablet 600 mg  600 mg Oral Q8H PRN Linton Flemings, MD      . nicotine (NICODERM CQ - dosed in mg/24 hours) patch 21 mg  21 mg Transdermal Daily Linton Flemings, MD   21 mg at 08/14/14 1142  . ondansetron (ZOFRAN) tablet 4 mg  4 mg Oral Q8H PRN Linton Flemings, MD      . zolpidem [facility]) tablet 5 mg  5 mg Oral QHS PRN Linton Flemings, MD   5 mg at 08/14/14 0211   Current Outpatient Prescriptions  Medication Sig Dispense Refill  .  carbamazepine (TEGRETOL XR) 200 MG 12 hr tablet Take 1 tablet (200 mg total) by mouth 2 (two) times daily. (Patient not taking: Reported on 05/01/2014) 60 tablet 0  . ibuprofen (ADVIL,MOTRIN) 200 MG tablet Take 800 mg by mouth every 6 (six) hours as needed.    Marland Kitchen OLANZapine (ZYPREXA) 15 MG tablet Take 15 mg by mouth at bedtime.    . sodium chloride (OCEAN) 0.65 % SOLN nasal spray Place 1-2 sprays into both nostrils as needed for congestion.    . traZODone (DESYREL) 100 MG tablet Take 1 tablet (100 mg total) by mouth at bedtime and may repeat dose one time if needed. (Patient not taking: Reported on 05/01/2014) 60 tablet 0     Musculoskeletal: Strength & Muscle Tone: within normal limits Gait & Station: normal Patient leans: N/A  Psychiatric Specialty Exam: Physical Exam  Review of Systems  Constitutional: Negative.   HENT: Negative.   Eyes: Negative.   Respiratory: Negative.   Cardiovascular: Negative.   Gastrointestinal: Negative.   Genitourinary: Negative.   Musculoskeletal: Negative.   Skin: Negative.   Neurological: Negative.   Endo/Heme/Allergies: Negative.     Blood pressure 114/74, pulse 85, temperature 97.3 F (36.3 C), temperature source Oral, resp. rate 18, SpO2 100 %.There is no weight on file to calculate BMI.  General Appearance: Casual and Disheveled  Eye Contact::  Fair  Speech:  Clear and Coherent and Normal Rate  Volume:  Normal  Mood:  Angry, Anxious and Depressed  Affect:  Congruent, Depressed and Flat  Thought Process:  Coherent and Intact  Orientation:  Full (Time, Place, and Person)  Thought Content:  WDL  Suicidal Thoughts:  No  Homicidal Thoughts:  No  Memory:  Immediate;   Fair Recent;   Fair Remote;   Fair  Judgement:  Impaired  Insight:  Shallow  Psychomotor Activity:  Normal  Concentration:  Fair  Recall:  NA  Fund of Knowledge:Fair  Language: Good  Akathisia:  NA  Handed:  Right  AIMS (if indicated):     Assets:  Desire for Improvement  ADL's:  Impaired  Cognition: WNL  Sleep:      Medical Decision Making: Review of Psycho-Social Stressors (1)  Plan:  Offer PRN medications as needed, Resume Olanzapine 15 mg po at bed time for mood control, Trazodone 50 mg po qhs for sleep Disposition:  Observe overnight and re-evaluate in am  Delfin Gant   PMHNP-BC 08/14/2014 2:22 PM   Patient seen and I agree with treatment and plan  Levonne Spiller M.D.

## 2014-08-14 NOTE — ED Notes (Signed)
Pt is alert and oriented.  Denies SI HI and AVH.  No complaints of pain or discomfort.  15 minute checks in place.

## 2014-08-14 NOTE — [facility] Counselor (Signed)
Dr Tenny Crawoss completed First Opinion and writer placed form in pt's chart.  Evette Cristalaroline Paige Debbora Ang, [place] Therapeutic Triage Specialist

## 2014-08-14 NOTE — BH Assessment (Signed)
Assessment completed. Consulted Hulan FessIjeoma Nwaeze, NP who recommended inpatient treatment. Dr. Norlene Campbelltter has been informed of the recommendation.

## 2014-08-15 ENCOUNTER — Encounter (HOSPITAL_COMMUNITY): Payer: Self-pay | Admitting: *Deleted

## 2014-08-15 ENCOUNTER — Inpatient Hospital Stay (HOSPITAL_COMMUNITY)
Admission: AD | Admit: 2014-08-15 | Discharge: 2014-08-17 | DRG: 885 | Disposition: A | Discharge status: Home / Self Care | Payer: Federal, State, Local not specified - Other | Source: Intra-hospital | Attending: Psychiatry | Admitting: Psychiatry

## 2014-08-15 DIAGNOSIS — F1110 Opioid abuse, uncomplicated: Secondary | ICD-10-CM | POA: Diagnosis present

## 2014-08-15 DIAGNOSIS — Z818 Family history of other mental and behavioral disorders: Secondary | ICD-10-CM

## 2014-08-15 DIAGNOSIS — F1994 Other psychoactive substance use, unspecified with psychoactive substance-induced mood disorder: Secondary | ICD-10-CM | POA: Diagnosis present

## 2014-08-15 DIAGNOSIS — G4700 Insomnia, unspecified: Secondary | ICD-10-CM | POA: Diagnosis present

## 2014-08-15 DIAGNOSIS — F319 Bipolar disorder, unspecified: Secondary | ICD-10-CM | POA: Diagnosis present

## 2014-08-15 DIAGNOSIS — F1220 Cannabis dependence, uncomplicated: Secondary | ICD-10-CM | POA: Diagnosis present

## 2014-08-15 DIAGNOSIS — F17210 Nicotine dependence, cigarettes, uncomplicated: Secondary | ICD-10-CM | POA: Diagnosis present

## 2014-08-15 DIAGNOSIS — F1910 Other psychoactive substance abuse, uncomplicated: Secondary | ICD-10-CM | POA: Diagnosis present

## 2014-08-15 DIAGNOSIS — F419 Anxiety disorder, unspecified: Secondary | ICD-10-CM | POA: Diagnosis present

## 2014-08-15 DIAGNOSIS — F3160 Bipolar disorder, current episode mixed, unspecified: Secondary | ICD-10-CM | POA: Diagnosis present

## 2014-08-15 MED ORDER — NICOTINE 21 MG/24HR TD PT24
21.0000 mg | MEDICATED_PATCH | Freq: Every day | TRANSDERMAL | Status: DC
  Administered 2014-08-16: 21 mg via TRANSDERMAL
  Filled 2014-08-15 (×3): qty 1

## 2014-08-15 MED ORDER — HYDROXYZINE HCL 50 MG PO TABS
50.0000 mg | ORAL_TABLET | Freq: Four times a day (QID) | ORAL | Status: DC | PRN
  Administered 2014-08-15 – 2014-08-16 (×3): 50 mg via ORAL
  Filled 2014-08-15 (×6): qty 1

## 2014-08-15 MED ORDER — IBUPROFEN 600 MG PO TABS: 600.0000 mg | ORAL_TABLET | Freq: Three times a day (TID) | ORAL | Status: DC | PRN

## 2014-08-15 MED ORDER — ALUM & MAG HYDROXIDE-SIMETH 200-200-20 MG/5ML PO SUSP: 30.0000 mL | ORAL | Status: DC | PRN

## 2014-08-15 MED ORDER — OLANZAPINE 5 MG PO TBDP
15.0000 mg | ORAL_TABLET | Freq: Every day | ORAL | Status: DC
  Administered 2014-08-15 – 2014-08-16 (×2): 15 mg via ORAL
  Filled 2014-08-15 (×8): qty 1

## 2014-08-15 MED ORDER — ONDANSETRON HCL 4 MG PO TABS: 4.0000 mg | ORAL_TABLET | Freq: Three times a day (TID) | ORAL | Status: DC | PRN

## 2014-08-15 MED ORDER — HYDROXYZINE HCL 25 MG PO TABS: 50.0000 mg | ORAL_TABLET | Freq: Four times a day (QID) | ORAL | Status: DC | PRN

## 2014-08-15 MED ORDER — ACETAMINOPHEN 325 MG PO TABS
650.0000 mg | ORAL_TABLET | ORAL | Status: DC | PRN
Start: 2014-08-15 — End: 2014-08-17

## 2014-08-15 NOTE — Tx Team (Signed)
Initial Interdisciplinary Treatment Plan   PATIENT STRESSORS: Financial difficulties Marital or family conflict Substance abuse   PATIENT STRENGTHS: Ability for insight Average or above average intelligence Capable of independent living Supportive family/friends   PROBLEM LIST: Problem List/Patient Goals Date to be addressed Date deferred Reason deferred Estimated date of resolution  Risk for Suicide 08/15/2014   08/20/2014  Substance Abuse 08/15/2014   08/20/2014                                             DISCHARGE CRITERIA:  Ability to meet basic life and health needs Improved stabilization in mood, thinking, and/or behavior Motivation to continue treatment in a less acute level of care  PRELIMINARY DISCHARGE PLAN: Attend aftercare/continuing care group Outpatient therapy Return to previous living arrangement  PATIENT/FAMIILY INVOLVEMENT: This treatment plan has been presented to and reviewed with the patient, James Walsh, and/or family member..  The patient and family have been given the opportunity to ask questions and make suggestions.  James Walsh, James Walsh 08/15/2014, 6:31 PM

## 2014-08-15 NOTE — Progress Notes (Signed)
Pt signed consent form for team to speak with pt's mother. CSW placed on chart.  York SpanielAlexandra Lamekia Nolden [facility] Lebanon Community HospitalCSWA Clinical Social Worker [facility] phone: [phone]

## 2014-08-15 NOTE — Consult Note (Signed)
[name] Consult   Reason for Consult:  Bipolar disorder Mixed Referring Physician:  EDP Patient Identification: James Walsh MRN:  [id number] Principal Diagnosis: Bipolar affective, mixed Diagnosis:   Patient Active Problem List   Diagnosis Date Noted  . Bipolar affective, mixed [F31.60] 08/14/2014    Priority: High  . Polysubstance abuse [F19.10]   . Substance induced mood disorder [F19.94] 05/07/2013  . Bipolar disorder, unspecified [F31.9] 05/07/2013  . Opiate abuse, continuous [F11.10] 05/07/2013  . Cannabis dependence [F12.20] 05/07/2013    Total Time spent with patient: 45 minutes  Subjective:   James Walsh is a 29 y.o. male patient admitted with Bipolar disorder, Mixed.  HPI: Caucasian male, 30 years old was evaluated for Polysubstance abuse, suicidal gesture by demanding his gun be given to him.  Patient was IVC by his mother.   Mother  also stated that patient is using illicit drugs and have been threatening the family after using.   Patient today denied every accusation by his mother and stated that he only asked for his gun to be given to him because it belong to him.  Patient admitted to a hx of Bipolar disorder and anxiety and states he sees a provider  for his medications.   He reports he took his medication Zyprexa Thursday night.  He has been hospitalized at [facility] last year for substance abuse.  Patient repeatedly stated that his mother lied about his [place] and brought him to the hospital.  Patient reports that he has a prescription for his Klonopin but admits to buying Suboxone, Adderall from the street and uses Marijuana.  We will keep patient overnight and reevaluate in am.  Due to collateral information gathered from mother,  and extensive use of multiple illicit substances coupled with recent aggressive and agitation behavior we will admit patient for stabilization and safe detox.   Bed has been assigned for patient and he will be moved to his  room as soon as possible.  HPI Elements:   Location:  Substance induced mood disorder, Bipolar disorder, mixed, . Quality:  severe-moderate. Severity:  Severe-moderate. Timing:  Acute. Duration:  Chronic mental illness. Context:  IVC by his mother for aggression and agitataion and for asking for his gun..  Past Medical History:  Past Medical History  Diagnosis Date  . Mental disorder   . Bipolar disorder     Past Surgical History  Procedure Laterality Date  . Fracture surgery      right elbow  . Eye surgery Right     metal plate    Family History: History reviewed. No pertinent family history. Social History:  History  Alcohol Use No    Comment: occasional     History  Drug Use  . Yes    Comment: 08/13/2014 denies drugs, uses pills mentioned in traige note __ denies 05/01/14 --(05/2013)admitts to using methadone this am took 2 $Remo'10mg'TPQWm$   dose of meth., xnanx 1/$RemoveBefore'2mg'pbOhBwVrNXzPk$  last time used was 1-2weeks.    History   Social History  . Marital Status: Married    Spouse Name: N/A  . Number of Children: N/A  . Years of Education: N/A   Social History Main Topics  . Smoking status: Current Every Day Smoker -- 1.00 packs/day    Types: Cigarettes  . Smokeless tobacco: Not on file  . Alcohol Use: No     Comment: occasional  . Drug Use: Yes     Comment: 08/13/2014 denies drugs, uses pills mentioned in traige note  __ denies 05/01/14 --(05/2013)admitts to using methadone this am took 2 $Remo'10mg'HklYW$   dose of meth., xnanx 1/$RemoveBefore'2mg'tQtrVzItrlUPw$  last time used was 1-2weeks.  Marland Kitchen Sexual Activity: Not on file   Other Topics Concern  . None   Social History Narrative   Additional Social History:    History of alcohol / drug use?:  (UDS is positive for Opiates, Benzodiazepines, Amphetamines and Barbiturates. ) Name of Substance 1: Heroin  1 - Age of First Use: 27 1 - Amount (size/oz): 1/2 gram  1 - Frequency: daily  1 - Duration: 1 year  1 - Last Use / Amount: 2015 Name of Substance 2: Suboxone  2 - Frequency:  unknown  2 - Duration: ongoing  2 - Last Use / Amount: 08-12-14                 Allergies:  No Known Allergies  Labs:  Results for orders placed or performed during the hospital encounter of 08/13/14 (from the past 48 hour(s))  Urine rapid drug screen (hosp performed)not at [facility]     Status: Abnormal   Collection Time: 08/14/14 12:05 AM  Result Value Ref Range   Opiates POSITIVE (A) NONE DETECTED   Cocaine NONE DETECTED NONE DETECTED   Benzodiazepines POSITIVE (A) NONE DETECTED   Amphetamines POSITIVE (A) NONE DETECTED   Tetrahydrocannabinol NONE DETECTED NONE DETECTED   Barbiturates POSITIVE (A) NONE DETECTED    Comment:        DRUG SCREEN FOR MEDICAL PURPOSES ONLY.  IF CONFIRMATION IS NEEDED FOR ANY PURPOSE, NOTIFY LAB WITHIN 5 DAYS.        LOWEST DETECTABLE LIMITS FOR URINE DRUG SCREEN Drug Class       Cutoff (ng/mL) Amphetamine      1000 Barbiturate      200 Benzodiazepine   756 Tricyclics       433 Opiates          300 Cocaine          300 THC              50   Acetaminophen level     Status: Abnormal   Collection Time: 08/14/14 12:11 AM  Result Value Ref Range   Acetaminophen (Tylenol), Serum <10 (L) 10 - 30 ug/mL    Comment:        THERAPEUTIC CONCENTRATIONS VARY SIGNIFICANTLY. A RANGE OF 10-30 ug/mL MAY BE AN EFFECTIVE CONCENTRATION FOR MANY PATIENTS. HOWEVER, SOME ARE BEST TREATED AT CONCENTRATIONS OUTSIDE THIS RANGE. ACETAMINOPHEN CONCENTRATIONS >150 ug/mL AT 4 HOURS AFTER INGESTION AND >50 ug/mL AT 12 HOURS AFTER INGESTION ARE OFTEN ASSOCIATED WITH TOXIC REACTIONS.   CBC     Status: None   Collection Time: 08/14/14 12:11 AM  Result Value Ref Range   WBC 9.3 4.0 - 10.5 K/uL   RBC 4.98 4.22 - 5.81 MIL/uL   Hemoglobin 14.7 13.0 - 17.0 g/dL   HCT 44.6 39.0 - 52.0 %   MCV 89.6 78.0 - 100.0 fL   MCH 29.5 26.0 - 34.0 pg   MCHC 33.0 30.0 - 36.0 g/dL   RDW 13.9 11.5 - 15.5 %   Platelets 233 150 - 400 K/uL  Comprehensive metabolic panel      Status: Abnormal   Collection Time: 08/14/14 12:11 AM  Result Value Ref Range   Sodium 140 135 - 145 mmol/L   Potassium 4.2 3.5 - 5.1 mmol/L   Chloride 105 101 - 111 mmol/L   CO2 29 22 - 32 mmol/L  Glucose, Bld 109 (H) 65 - 99 mg/dL   BUN 15 6 - 20 mg/dL   Creatinine, Ser 6.12 0.61 - 1.24 mg/dL   Calcium 9.0 8.9 - 43.2 mg/dL   Total Protein 7.5 6.5 - 8.1 g/dL   Albumin 4.5 3.5 - 5.0 g/dL   AST 755 (H) 15 - 41 U/L   ALT 280 (H) 17 - 63 U/L   Alkaline Phosphatase 60 38 - 126 U/L   Total Bilirubin 0.4 0.3 - 1.2 mg/dL   GFR calc non Af Amer >60 >60 mL/min   GFR calc Af Amer >60 >60 mL/min    Comment: (NOTE) The eGFR has been calculated using the CKD EPI equation. This calculation has not been validated in all clinical situations. eGFR's persistently <60 mL/min signify possible Chronic Kidney Disease.    Anion gap 6 5 - 15  Ethanol (ETOH)     Status: None   Collection Time: 08/14/14 12:11 AM  Result Value Ref Range   Alcohol, Ethyl (B) <5 <5 mg/dL    Comment:        LOWEST DETECTABLE LIMIT FOR SERUM ALCOHOL IS 5 mg/dL FOR MEDICAL PURPOSES ONLY   Salicylate level     Status: None   Collection Time: 08/14/14 12:11 AM  Result Value Ref Range   Salicylate Lvl <4.0 2.8 - 30.0 mg/dL    Vitals: Blood pressure 121/72, pulse 81, temperature 98.1 F (36.7 C), temperature source Oral, resp. rate 16, SpO2 96 %.  Risk to Self: Suicidal Ideation: No Suicidal Intent: No Is patient at risk for suicide?: No Suicidal Plan?: No Access to Means: No What has been your use of drugs/alcohol within the last 12 months?: Pt reported a p How many times?: 0 Other Self Harm Risks: No other self harm risk identified at this time.  Triggers for Past Attempts: None known Intentional Self Injurious Behavior: None Risk to Others: Homicidal Ideation: No Thoughts of Harm to Others: No Current Homicidal Intent: No Current Homicidal Plan: No Access to Homicidal Means: No Identified Victim:  NA History of harm to others?: No Assessment of Violence: On admission Violent Behavior Description: No violent behaviors observed. Pt is calm and cooperative at this time.  Does patient have access to weapons?: Yes (Comment) (Pt rpted that he own several guns but does not have access 2) Criminal Charges Pending?: No Does patient have a court date: No Prior Inpatient Therapy: Prior Inpatient Therapy: Yes Prior Therapy Dates: 2003, 2007, 2015 Prior Therapy Facilty/Provider(s): [facility]  Reason for Treatment: bipolar, substance induced mood disorder  Prior Outpatient Therapy: Prior Outpatient Therapy: No Does patient have an ACCT team?: No Does patient have Intensive In-House Services?  : No Does patient have [facility] services? : No Does patient have P4CC services?: No  Current Facility-Administered Medications  Medication Dose Route Frequency Provider Last Rate Last Dose  . acetaminophen (TYLENOL) tablet 650 mg  650 mg Oral Q4H PRN Marisa Severin, MD      . alum & mag hydroxide-simeth (MAALOX/MYLANTA) 200-200-20 MG/5ML suspension 30 mL  30 mL Oral PRN Marisa Severin, MD      . hydrOXYzine (ATARAX/VISTARIL) tablet 25 mg  25 mg Oral Q6H PRN Marisa Severin, MD   25 mg at 08/14/14 2021  . ibuprofen (ADVIL,MOTRIN) tablet 600 mg  600 mg Oral Q8H PRN Marisa Severin, MD      . nicotine (NICODERM CQ - dosed in mg/24 hours) patch 21 mg  21 mg Transdermal Daily Marisa Severin, MD  21 mg at 08/15/14 0841  . OLANZapine zydis (ZYPREXA) disintegrating tablet 15 mg  15 mg Oral QHS Delfin Gant, NP   15 mg at 08/14/14 2022  . ondansetron (ZOFRAN) tablet 4 mg  4 mg Oral Q8H PRN Linton Flemings, MD      . traZODone (DESYREL) tablet 50 mg  50 mg Oral QHS Delfin Gant, NP   50 mg at 08/14/14 2021   Current Outpatient Prescriptions  Medication Sig Dispense Refill  . carbamazepine (TEGRETOL XR) 200 MG 12 hr tablet Take 1 tablet (200 mg total) by mouth 2 (two) times daily. (Patient not taking: Reported on 05/01/2014) 60  tablet 0  . ibuprofen (ADVIL,MOTRIN) 200 MG tablet Take 800 mg by mouth every 6 (six) hours as needed.    Marland Kitchen OLANZapine (ZYPREXA) 15 MG tablet Take 15 mg by mouth at bedtime.    . sodium chloride (OCEAN) 0.65 % SOLN nasal spray Place 1-2 sprays into both nostrils as needed for congestion.    . traZODone (DESYREL) 100 MG tablet Take 1 tablet (100 mg total) by mouth at bedtime and may repeat dose one time if needed. (Patient not taking: Reported on 05/01/2014) 60 tablet 0    Musculoskeletal: Strength & Muscle Tone: within normal limits Gait & Station: normal Patient leans: N/A  Psychiatric Specialty Exam: Physical Exam  Review of Systems  Constitutional: Negative.   HENT: Negative.   Eyes: Negative.   Respiratory: Negative.   Cardiovascular: Negative.   Gastrointestinal: Negative.   Genitourinary: Negative.   Musculoskeletal: Negative.   Skin: Negative.   Neurological: Negative.   Endo/Heme/Allergies: Negative.     Blood pressure 121/72, pulse 81, temperature 98.1 F (36.7 C), temperature source Oral, resp. rate 16, SpO2 96 %.There is no weight on file to calculate BMI.  General Appearance: Casual and Disheveled  Eye Contact::  Fair  Speech:  Clear and Coherent and Normal Rate  Volume:  Normal  Mood:  Angry, Anxious and Depressed  Affect:  Congruent, Depressed and Flat  Thought Process:  Coherent and Intact  Orientation:  Full (Time, Place, and Person)  Thought Content:  WDL  Suicidal Thoughts:  No  Homicidal Thoughts:  No  Memory:  Immediate;   Fair Recent;   Fair Remote;   Fair  Judgement:  Impaired  Insight:  Shallow  Psychomotor Activity:  Normal  Concentration:  Fair  Recall:  NA  Fund of Knowledge:Fair  Language: Good  Akathisia:  NA  Handed:  Right  AIMS (if indicated):     Assets:  Desire for Improvement  ADL's:  Impaired  Cognition: WNL  Sleep:      Medical Decision Making: Review of Psycho-Social Stressors (1)  Plan:   Continue all home medications as  already initiated.  Discontinue Trazodone and use Vistaril  for sleep. Disposition:  Accepted for admission and room is assigned Delfin Gant   PMHNP-BC 08/15/2014 2:53 PM Asian seen and I agree with treatment and plan  Griffin Dakin.D.

## 2014-08-15 NOTE — ED Notes (Signed)
Pt is awake and alert, pleasant and cooperative. Patient denies HI, SI AH or VH. Discharge vitals 123/79 HR 88 RR 16 and unlabored. [facility] Will continue to monitor for safety. Patient escorted to lobby with [employer] and sheriff department without incident. T.Melvyn NethLewis RN

## 2014-08-15 NOTE — Progress Notes (Signed)
Patient did not attend the evening speaker AA meeting. Pt was notified that group was beginning but pt was newly admitted to unit and remained in room to shower.

## 2014-08-15 NOTE — Progress Notes (Signed)
D: Patient calm and cooperative. Minimal conversation. Denies pain, SI, AH/VH. Endorses depression which he rated 4/10. Patient stated "I am fine. No much problem" looking away. No new complaint.  A: Support and encouragement offered to patient. Due medications given as ordered. Every 15 minutes check maintained for safety. Will continue to monitor patient for safety. R: Patient is safe.

## 2014-08-15 NOTE — Progress Notes (Signed)
Admit note : 29 y/o, w/m Involuntary ,  Admit from [facility]. Pt reports he's been detoxing self on suboxone 8 mg and also using percocet's when available approx 3-4 a week, along with Klonopin .5 bid and  Zyprexa. . Last dose Thursday when his mother took papers out on him. " She claims I have guns but she has my guns and making up shit about me." ." Pt denies A/V hall, has been fidgety during interview cooperative. States he lives alone and is out of work x 1 year. Pt was here 2 years ago for substance abuse and [facility]. Pt reports difficulty falling asleep but has been sleeping during the day, also difficulty concentrating. Oriented to the unit, Education provided about safety on the unit, including fall prevention. Nutrition offered, safety checks initiated every 15 minutes. Search completed.

## 2014-08-16 ENCOUNTER — Encounter (HOSPITAL_COMMUNITY): Payer: Self-pay | Admitting: Psychiatry

## 2014-08-16 DIAGNOSIS — F319 Bipolar disorder, unspecified: Secondary | ICD-10-CM

## 2014-08-16 MED ORDER — NICOTINE POLACRILEX 2 MG MT GUM
2.0000 mg | CHEWING_GUM | OROMUCOSAL | Status: DC | PRN
  Administered 2014-08-16: 2 mg via ORAL
  Filled 2014-08-16 (×2): qty 1

## 2014-08-16 NOTE — Progress Notes (Signed)
Recreation Therapy Notes  Date: 07.11.16 Time: 9:30 am Location: 300 Hall Group Room  Group Topic: Stress Management  Goal Area(s) Addresses:  Patient will verbalize importance of using healthy stress management.  Patient will identify positive emotions associated with healthy stress management.   Intervention: Stress Management  Activity :  Progressive Muscle Relaxation.  LRT introduced and educated patients on stress management technique of progressive muscle relaxation.  A script was used to deliver both techniques to patients.  Patients were asked to follow the script read a loud by the LRT to engage in practicing the technique.  Education:  Stress Management, Discharge Planning.   Education Outcome: Acknowledges edcuation/In group clarification offered/Needs additional education  Clinical Observations/Feedback: Patient did not attend group.    Caroll RancherMarjette Eh Sesay, LRT/CTRS         Caroll RancherLindsay, Bufford Helms A 08/16/2014 3:36 PM

## 2014-08-16 NOTE — [facility] Counselor (Signed)
Adult Comprehensive Assessment  Patient ID: James Walsh, male DOB: 1985/12/19, 29 y.o. MRN: [id number]  Information Source:    Current Stressors:  Educational / Learning stressors: N/A Employment / Job issues: Yes, unemployed Family Relationships: Yes, conflictual relationship with parents  Surveyor, quantity / Lack of resources (include bankruptcy): Yes, no steady income Housing / Lack of housing: Patient reports living alone in [place] Physical health (include injuries & life threatening diseases): N/A Social relationships: N/A Substance abuse: Pt denied any alcohol use but shared that he has been taking saboxone. Pt reported that he does not have prescription for it and shared that he does not take it daily. Pt also reported that he used heroin for 1 year prior to getting on Suboxone. Bereavement / Loss: N/A  Living/Environment/Situation:  Living Arrangements: Alone Living conditions (as described by patient or guardian): Reports living alone next door to his parents in [place] How long has patient lived in current situation?: "All my life"  What is atmosphere in current home: Comfortable  Family History:  Marital status: Single Does patient have children?: Yes How many children?: 2 How is patient's relationship with their children?: 2 YO twins that he gets to see regularly  Childhood History:  By whom was/is the patient raised?: Both parents Description of patient's relationship with caregiver when they were a child: "It was good."  Patient's description of current relationship with people who raised him/her: "Weird, I have no idea. They committed me here."  Does patient have siblings?: Yes Number of Siblings: 1 Description of patient's current relationship with siblings: 1 sister - "We're good."  Did patient suffer any verbal/emotional/physical/sexual abuse as a child?: No Did patient suffer from severe childhood neglect?: No Has patient ever been  sexually abused/assaulted/raped as an adolescent or adult?: No Was the patient ever a victim of a crime or a disaster?: No Witnessed domestic violence?: No Has patient been effected by domestic violence as an adult?: No  Education:  Currently a Consulting civil engineer?: No Learning disability?: No  Employment/Work Situation:  Employment situation: Unstable Part time work as a Web designer job has been impacted by current illness: No What is the longest time patient has a held a job?: 5 years  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 Pt reported that he does not have prescription for it and shared that he does not take it daily. Pt also reported that he used heroin for 1 year prior to getting on Suboxone. If attempted suicide, did drugs/alcohol play a role in this?: No Alcohol/Substance Abuse Treatment Hx: Attends AA/NA;Past detox;Past Tx, Inpatient;Past Tx, Outpatient If yes, describe treatment: Pt reports going to [facility] and [facility] in the past.  Has alcohol/substance abuse ever caused legal problems?: Yes (Posession - 5 years ago)  Social Support System:  Patient's Community Support System: Fair Museum/gallery exhibitions officer System: Sister, reports that his parents "used to be" positive supports Type of faith/religion: None How does patient's faith help to cope with current illness?: None   Leisure/Recreation:  Leisure and Hobbies: Naval architect  Strengths/Needs:  What things does the patient do well?: Baseball, hunting, and fishing In what  areas does patient struggle / problems for patient: "I  don't have any issues."   Discharge Plan:  Does patient have access to transportation?: Yes Will patient be returning to same living situation after discharge?: Yes Currently receiving community mental health services: No If no, would patient like referral for services when discharged?: Yes (What county?) Medical sales representative([place] ) Does patient have financial barriers related to discharge medications?: Yes Patient description of barriers related to discharge medications: No income   Summary/Recommendations:  Summary and Recommendations (to be completed by the evaluator): Patient is a 29 YO Causasian male who presents IVC'd by mother who reported that he was experiencing paranoia. Patient denies experiencing paranoia or SI on admission. He plans to return home at discharge and [facility]. He identifies his sister as supportive. He can benefit from crisis stabilization, therapeutic milieu, medicaiton management, and referral for services.   Samuella BruinKristin Tarvares Lant, MSW, [employer]CSWA Clinical Social Worker [facility] [phone]

## 2014-08-16 NOTE — Progress Notes (Signed)
Patient ID: James Walsh, male   DOB: 1985/04/03, 29 y.o.   MRN: [id number]  Pt currently presents with a flat affect and cooperative behavior. Per self inventory, pt rates depression at a 0, hopelessness 0 and anxiety 3. Pt's daily goal is to "go home" and they intend to do so by "go to group." Pt reports fair sleep, good concentration, high energy and a good appetite. Pt has minimal interaction with staff, forwards little. Pt is cooperative and attends groups.  Pt provided with medications per providers orders. Pt's labs and vitals were monitored throughout the day. Pt supported emotionally and encouraged to express concerns and questions. Pt educated on medications.  Pt's safety ensured with 15 minute and environmental checks. Pt currently denies SI/HI and A/V hallucinations. Pt verbally agrees to seek staff if SI/HI or A/VH occurs and to consult with staff before acting on these thoughts. Will continue POC.

## 2014-08-16 NOTE — Progress Notes (Signed)
Patient ID: James Walsh, male   DOB: 1985-08-01, 29 y.o.   MRN: [id number] D: Client has a visitor this evening "kinda my girlfriend" Client reports here because "had an argument with my mom, she had me committed." Client denies SHI. Client reports admission has been helpful, "been here a couple of times before" notes plan to stay on medication and follow up with doctor, "medication do help"  Client reports he knows mom just trying to be helpful, appreciate her more since his twins(29yo) where born, "I understand now" A: Writer provided emotional support, encouraged client to move forward with plans to follow up with physician and stay on medications. Client also encouraged to report any concerns about medications or other.  Staff will monitor q9515min for safety. R: Client is safe on the unit, attended group.

## 2014-08-16 NOTE — [facility] Group Notes (Signed)
[facility] LCSW Aftercare Discharge Planning Group Note  08/16/2014  8:45 AM  Participation Quality: Did Not Attend. Patient invited to participate but declined.   Zykeem Bauserman, MSW, LCSWA Clinical Social Worker [facility] [phone]  

## 2014-08-16 NOTE — Progress Notes (Signed)
Adult Psychoeducational Group Note  Date:  08/16/2014 Time:  9:52 PM  Group Topic/Focus:  Wrap-Up Group:   The focus of this group is to help patients review their daily goal of treatment and discuss progress on daily workbooks.  Participation Level:  Active  Participation Quality:  Appropriate and Attentive  Affect:  Appropriate  Cognitive:  Appropriate  Insight: Appropriate  Engagement in Group:  Engaged  Modes of Intervention:  Discussion  Additional Comments:  Pt stated his day was better than yesterday. Pt stated he had lots of anxiety and is ready to discharge. Pt stated he will try to accomplish this by talking to the doctor and going to groups. Pt was encouraged to not try to discharge too early, as many times it can interfere with recovery if you try to rush discharge.  Caswell CorwinOwen, Minoru Chap C 08/16/2014, 9:52 PM

## 2014-08-16 NOTE — H&P (Signed)
Psychiatric Admission Assessment Adult  Patient Identification: James Walsh MRN:  [id number] Date of Evaluation:  08/16/2014 Chief Complaint:  BIPOLAR Principal Diagnosis: <principal problem not specified> Diagnosis:   Patient Active Problem List   Diagnosis Date Noted  . Bipolar I disorder, most recent episode (or current) unspecified [F31.9] 08/15/2014  . Bipolar affective, mixed [F31.60] 08/14/2014  . Polysubstance abuse [F19.10]   . Substance induced mood disorder [F19.94] 05/07/2013  . Bipolar disorder, unspecified [F31.9] 05/07/2013  . Opiate abuse, continuous [F11.10] 05/07/2013  . Cannabis dependence [F12.20] 05/07/2013   History of Present Illness:: 29 Y/O male who states his mother took papers on him. States he does not know why. Admits that had used heroin up to couple months ago. He buys Suboxone off the street. He admits to have used Percocet before he came here as well as some Adderall. He also agrees that he has Bipolar Disorder but that as long as he has the Zyprexa he does OK. He has been staying in a trailer that he owns  by himself. Admits that he might be around people he needs to avoid. He states that he might be a little paranoid but not to the extend of what his mother describes and that he did not messed up his mother's car and that he was wanting to get the guns to give them to her friend and not to kill himself. Sates that he has his two daughters and that he would never do anything to hurt himself as he wants to be there for them Elements:  Location:  polysubstance abuse including opioids, Bipolar Disorder. Quality:  increasing restlessness suspiciouseness, on and off medications with episodic use of drugs . Severity:  severe. Timing:  every day. Duration:  building up for the last weeks. Context:  underlying bipolar disorder with some possible paranoid behavior secondary to his susbtance use and not being on medications for his bipolar disorder. Associated  Signs/Symptoms: Depression Symptoms:  depressed mood, anxiety, (Hypo) Manic Symptoms:  Labiality of Mood, Anxiety Symptoms:  Excessive Worry, Psychotic Symptoms:  Paranoia, PTSD Symptoms: Negative Total Time spent with patient: 45 minutes  Past Medical History:  Past Medical History  Diagnosis Date  . Mental disorder   . Bipolar disorder     Past Surgical History  Procedure Laterality Date  . Fracture surgery      right elbow  . Eye surgery Right     metal plate    Family History: History reviewed. No pertinent family history.  Father has Bipolar Disorder. Uncle was alcoholic killed himself Social History:  History  Alcohol Use No    Comment: occasional     History  Drug Use  . Yes  . Special: Oxycodone    Comment: 08/13/2014 denies drugs, uses pills mentioned in traige note __ denies 05/01/14 --(05/2013)admitts to using methadone this am took 2 $Remo'10mg'QePBX$   dose of meth., xnanx 1/$RemoveBefore'2mg'SfdFxGIZyLVhe$  last time used was 1-2weeks.    History   Social History  . Marital Status: Married    Spouse Name: N/A  . Number of Children: N/A  . Years of Education: N/A   Social History Main Topics  . Smoking status: Current Every Day Smoker -- 1.00 packs/day    Types: Cigarettes  . Smokeless tobacco: Current User  . Alcohol Use: No     Comment: occasional  . Drug Use: Yes    Special: Oxycodone     Comment: 08/13/2014 denies drugs, uses pills mentioned in traige note __ denies 05/01/14 --(  05/2013)admitts to using methadone this am took 2 $Remo'10mg'CnLZW$   dose of meth., xnanx 1/$RemoveBefore'2mg'nfpHGdYhacHfV$  last time used was 1-2weeks.  Marland Kitchen Sexual Activity: Not Currently    Birth Control/ Protection: Condom   Other Topics Concern  . None   Social History Narrative  lives by himself. Works in Biomedical scientist. Has 2 kids twin girls 2 Y/O. 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 While in [employer] his uncle killed himself what affected him deeply, and he was also smoking pot. He quit school. 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   HENT: Negative.   Eyes: Negative.   Respiratory:       Pack a day  Cardiovascular: Negative.   Gastrointestinal: Negative.   Genitourinary: Negative.   Musculoskeletal: Negative.   Skin: Negative.   Endo/Heme/Allergies: Negative.   Psychiatric/Behavioral: Positive for substance abuse.    Blood pressure 137/74, pulse 115, temperature 98.2 F (36.8 C), temperature source Oral, resp. rate 16, height $RemoveBe'5\' 7"'zsgnYcvFD$  (1.702 m), weight 83.008 kg (183 lb).Body mass index is 28.66 kg/(m^2).  General Appearance: Fairly Groomed  Engineer, water::  Fair  Speech:  Clear and Coherent  Volume:  Normal  Mood:  Anxious and states he gets anxious in confined spaces being around strangers he usually smokes to take care of the anxiety   Affect:  Restricted  Thought Process:  Coherent and Goal Directed  Orientation:  Full (Time, Place, and Person)  Thought Content:  sympotms events worries concerns  Suicidal Thoughts:  No  Homicidal Thoughts:  No  Memory:  Immediate;   Fair Recent;   Fair Remote;   Fair  Judgement:  Fair  Insight:  Present  Psychomotor Activity:  Restlessness  Concentration:  Fair  Recall:  [employer] of Knowledge:Fair  Language: Fair  Akathisia:  No  Handed:  Right  AIMS (if indicated):     Assets:  Desire for Improvement Housing Social Support  ADL's:  Intact  Cognition: WNL  Sleep:      Risk to Self: Is patient at risk for suicide?: No Risk to Others:   Prior Inpatient Therapy:  [facility]  Prior Outpatient Therapy:  [facility]    Alcohol Screening: Patient refused Alcohol Screening Tool: Yes 1. How often do you have a drink containing alcohol?: Never  Allergies:  No Known  Allergies Lab Results: No results found for this or any previous visit (from the past 48 hour(s)). Current Medications: Current Facility-Administered Medications  Medication Dose Route Frequency Provider Last Rate Last Dose  . acetaminophen (TYLENOL) tablet 650 mg  650 mg Oral Q4H PRN Delfin Gant, NP      . alum & mag hydroxide-simeth (MAALOX/MYLANTA) 200-200-20 MG/5ML suspension 30 mL  30 mL Oral PRN Delfin Gant, NP      . hydrOXYzine (ATARAX/VISTARIL) tablet 50 mg  50 mg Oral Q6H PRN Delfin Gant, NP   50 mg at 08/15/14 2133  . ibuprofen (ADVIL,MOTRIN) tablet 600 mg  600 mg Oral Q8H PRN Delfin Gant, NP      . nicotine (NICODERM CQ - dosed in mg/24 hours) patch 21 mg  21 mg  Transdermal Daily Delfin Gant, NP   21 mg at 08/16/14 0809  . OLANZapine zydis (ZYPREXA) disintegrating tablet 15 mg  15 mg Oral QHS Delfin Gant, NP   15 mg at 08/15/14 2131  . ondansetron (ZOFRAN) tablet 4 mg  4 mg Oral Q8H PRN Delfin Gant, NP       PTA Medications: Prescriptions prior to admission  Medication Sig Dispense Refill Last Dose  . carbamazepine (TEGRETOL XR) 200 MG 12 hr tablet Take 1 tablet (200 mg total) by mouth 2 (two) times daily. (Patient not taking: Reported on 05/01/2014) 60 tablet 0   . ibuprofen (ADVIL,MOTRIN) 200 MG tablet Take 800 mg by mouth every 6 (six) hours as needed.   Past Month at Unknown time  . OLANZapine (ZYPREXA) 15 MG tablet Take 15 mg by mouth at bedtime.   04/30/2014 at Unknown time  . sodium chloride (OCEAN) 0.65 % SOLN nasal spray Place 1-2 sprays into both nostrils as needed for congestion.   04/30/2014 at Unknown time    Previous Psychotropic Medications: Yes Zyprexa  Substance Abuse History in the last 12 months:  Yes.      Consequences of Substance Abuse: Legal Consequences:  drug related charges  Results for orders placed or performed during the hospital encounter of 08/13/14 (from the past 72 hour(s))  Urine rapid drug  screen (hosp performed)not at [facility]     Status: Abnormal   Collection Time: 08/14/14 12:05 AM  Result Value Ref Range   Opiates POSITIVE (A) NONE DETECTED   Cocaine NONE DETECTED NONE DETECTED   Benzodiazepines POSITIVE (A) NONE DETECTED   Amphetamines POSITIVE (A) NONE DETECTED   Tetrahydrocannabinol NONE DETECTED NONE DETECTED   Barbiturates POSITIVE (A) NONE DETECTED    Comment:        DRUG SCREEN FOR MEDICAL PURPOSES ONLY.  IF CONFIRMATION IS NEEDED FOR ANY PURPOSE, NOTIFY LAB WITHIN 5 DAYS.        LOWEST DETECTABLE LIMITS FOR URINE DRUG SCREEN Drug Class       Cutoff (ng/mL) Amphetamine      1000 Barbiturate      200 Benzodiazepine   353 Tricyclics       614 Opiates          300 Cocaine          300 THC              50   Acetaminophen level     Status: Abnormal   Collection Time: 08/14/14 12:11 AM  Result Value Ref Range   Acetaminophen (Tylenol), Serum <10 (L) 10 - 30 ug/mL    Comment:        THERAPEUTIC CONCENTRATIONS VARY SIGNIFICANTLY. A RANGE OF 10-30 ug/mL MAY BE AN EFFECTIVE CONCENTRATION FOR MANY PATIENTS. HOWEVER, SOME ARE BEST TREATED AT CONCENTRATIONS OUTSIDE THIS RANGE. ACETAMINOPHEN CONCENTRATIONS >150 ug/mL AT 4 HOURS AFTER INGESTION AND >50 ug/mL AT 12 HOURS AFTER INGESTION ARE OFTEN ASSOCIATED WITH TOXIC REACTIONS.   CBC     Status: None   Collection Time: 08/14/14 12:11 AM  Result Value Ref Range   WBC 9.3 4.0 - 10.5 K/uL   RBC 4.98 4.22 - 5.81 MIL/uL   Hemoglobin 14.7 13.0 - 17.0 g/dL   HCT 44.6 39.0 - 52.0 %   MCV 89.6 78.0 - 100.0 fL   MCH 29.5 26.0 - 34.0 pg   MCHC 33.0 30.0 - 36.0 g/dL   RDW 13.9 11.5 - 15.5 %   Platelets 233 150 -  400 K/uL  Comprehensive metabolic panel     Status: Abnormal   Collection Time: 08/14/14 12:11 AM  Result Value Ref Range   Sodium 140 135 - 145 mmol/L   Potassium 4.2 3.5 - 5.1 mmol/L   Chloride 105 101 - 111 mmol/L   CO2 29 22 - 32 mmol/L   Glucose, Bld 109 (H) 65 - 99 mg/dL   BUN 15 6 - 20 mg/dL    Creatinine, Ser 0.95 0.61 - 1.24 mg/dL   Calcium 9.0 8.9 - 10.3 mg/dL   Total Protein 7.5 6.5 - 8.1 g/dL   Albumin 4.5 3.5 - 5.0 g/dL   AST 122 (H) 15 - 41 U/L   ALT 280 (H) 17 - 63 U/L   Alkaline Phosphatase 60 38 - 126 U/L   Total Bilirubin 0.4 0.3 - 1.2 mg/dL   GFR calc non Af Amer >60 >60 mL/min   GFR calc Af Amer >60 >60 mL/min    Comment: (NOTE) The eGFR has been calculated using the CKD EPI equation. This calculation has not been validated in all clinical situations. eGFR's persistently <60 mL/min signify possible Chronic Kidney Disease.    Anion gap 6 5 - 15  Ethanol (ETOH)     Status: None   Collection Time: 08/14/14 12:11 AM  Result Value Ref Range   Alcohol, Ethyl (B) <5 <5 mg/dL    Comment:        LOWEST DETECTABLE LIMIT FOR SERUM ALCOHOL IS 5 mg/dL FOR MEDICAL PURPOSES ONLY   Salicylate level     Status: None   Collection Time: 08/14/14 12:11 AM  Result Value Ref Range   Salicylate Lvl <6.3 2.8 - 30.0 mg/dL    Observation Level/Precautions:  15 minute checks  Laboratory:  As per the ED and hemoglobin A 1C and acute hepatitis profile and lipid profile  Psychotherapy:  Individual/group  Medications:  Will resume the Zyprexa ,will identify detox needs   Consultations:    Discharge Concerns:    Estimated LOS: 3 days  Other:     Psychological Evaluations: No   Treatment Plan Summary: Daily contact with patient to assess and evaluate symptoms and progress in treatment and Medication management Supportive approach/coping skills Polysubstance abuse including opioids: will reassess detox needs and work a relapse prevention plan Mood instability; will resume the Zyprexa at 15 mg HS Paranoia; R/P substance induced, will continue the Zyprexa and reassess for effectiveness  Will work to improve reality testing Use CBT/mindfulness Increased liver enzymes, will check for Hepatitis. As he is on Zyprexa will get a hemoglobin A 1 C and lipid profiles Medical Decision  Making:  Review of Psycho-Social Stressors (1), Review or order clinical lab tests (1), Review of Medication Regimen & Side Effects (2) and Review of New Medication or Change in Dosage (2)  I certify that inpatient services furnished can reasonably be expected to improve the patient's condition.   Cristal Qadir A 7/11/201611:50 AM

## 2014-08-16 NOTE — [facility] Group Notes (Signed)
[facility] LCSW Group Therapy 08/16/2014  1:15 pm  Type of Therapy: Group Therapy Participation Level: Active  Participation Quality: Attentive, Sharing and Supportive  Affect: Appropriate  Cognitive: Alert and Oriented  Insight: Developing/Improving and Engaged  Engagement in Therapy: Developing/Improving and Engaged  Modes of Intervention: Clarification, Confrontation, Discussion, Education, Exploration,  Limit-setting, Orientation, Problem-solving, Rapport Building, Dance movement psychotherapisteality Testing, Socialization and Support  Summary of Progress/Problems: Pt identified obstacles faced currently and processed barriers involved in overcoming these obstacles. Pt identified steps necessary for overcoming these obstacles and explored motivation (internal and external) for facing these difficulties head on. Pt further identified one area of concern in their lives and chose a goal to focus on for today. Patient identified "staying clean" as his goal. He reports that he likes to use positive coping skills such as playing basketball and playing with his 29 year old twin daughters. He reports that he may stay with his sister at discharge who is a positive support.  Samuella BruinKristin Telitha Plath, MSW, [employer] Inc[employer]CSWA Clinical Social Worker [facility] [phone]

## 2014-08-17 LAB — LIPID PANEL
Cholesterol: 120 mg/dL (ref 0–200)
HDL: 36 mg/dL — ABNORMAL LOW (ref >40)
LDL Cholesterol: 73 mg/dL (ref 0–99)
Total CHOL/HDL Ratio: 3.3 RATIO
Triglycerides: 56 mg/dL (ref <150)
VLDL: 11 mg/dL (ref 0–40)

## 2014-08-17 MED ORDER — HYDROXYZINE HCL 50 MG PO TABS: 50.0000 mg | ORAL_TABLET | Freq: Four times a day (QID) | ORAL | Status: DC | PRN

## 2014-08-17 MED ORDER — TRAZODONE HCL 50 MG PO TABS
50.0000 mg | ORAL_TABLET | Freq: Every evening | ORAL | Status: DC | PRN
  Administered 2014-08-17: 50 mg via ORAL
  Filled 2014-08-17 (×8): qty 1

## 2014-08-17 MED ORDER — TRAZODONE HCL 50 MG PO TABS: 50.0000 mg | ORAL_TABLET | Freq: Every evening | ORAL | Status: DC | PRN

## 2014-08-17 MED ORDER — OLANZAPINE 15 MG PO TBDP: 15.0000 mg | ORAL_TABLET | Freq: Every day | ORAL | Status: DC

## 2014-08-17 NOTE — [facility] Suicide Risk Assessment (Signed)
[facility]   Demographic Factors:  Male and Caucasian  Total Time spent with patient: 30 minutes  Musculoskeletal: Strength & Muscle Tone: within normal limits Gait & Station: normal Patient leans: normal  Psychiatric Specialty Exam: Physical Exam  Review of Systems  Constitutional: Negative.   HENT: Negative.   Eyes: Negative.   Respiratory: Negative.   Cardiovascular: Negative.   Gastrointestinal: Negative.   Genitourinary: Negative.   Musculoskeletal: Positive for joint pain.  Skin: Negative.   Neurological: Negative.   Endo/Heme/Allergies: Negative.   Psychiatric/Behavioral: Positive for substance abuse. The patient is nervous/anxious.     Blood pressure 132/54, pulse 108, temperature 98.1 F (36.7 C), temperature source Oral, resp. rate 18, height 5\' 7"  (1.702 m), weight 83.008 kg (183 lb).Body mass index is 28.66 kg/(m^2).  General Appearance: Fairly Groomed  Patent attorneyye Contact::  Fair  Speech:  Clear and Coherent409  Volume:  Normal  Mood:  Euthymic  Affect:  Appropriate  Thought Process:  Coherent and Goal Directed  Orientation:  Full (Time, Place, and Person)  Thought Content:  plans as he moves on, relapse prevention plan  Suicidal Thoughts:  No  Homicidal Thoughts:  No  Memory:  Immediate;   Fair Recent;   Fair Remote;   Fair  Judgement:  Fair  Insight:  Fair  Psychomotor Activity:  Normal  Concentration:  Fair  Recall:  [employer]  Fund of Knowledge:Fair  Language: Fair  Akathisia:  No  Handed:  Right  AIMS (if indicated):     Assets:  Desire for Improvement Housing Social Support Transportation Vocational/Educational  Sleep:  Number of Hours: 5.75  Cognition: WNL  ADL's:  Intact   Have you used any form of tobacco in the last 30 days? (Cigarettes, Smokeless Tobacco, Cigars, and/or Pipes): Patient Refused Screening  Has this patient used any form of tobacco in the last 30 days? (Cigarettes, Smokeless Tobacco, Cigars, and/or  Pipes) Yes, A prescription for an FDA-approved tobacco cessation medication was offered at discharge and the patient refused  Mental Status Per Nursing Assessment::   On Admission:  Suicidal ideation indicated by others, Plan includes specific time, place, or method, Self-harm behaviors  Current Mental Status by Physician:In full contact with reality. There are no active SI plans or intent. No active delusions, hallucinations. He states he is committed to abstinence. He plans to stay with his sister. He is wiling to continue outpatient follow up and to take his medications   Loss Factors: NA  Historical Factors: Family history of suicide  Risk Reduction Factors:   Sense of responsibility to family, Living with another person, especially a relative and Positive social support  Continued Clinical Symptoms:  Bipolar Disorder:   Bipolar II Alcohol/Substance Abuse/Dependencies  Cognitive Features That Contribute To Risk:  Closed-mindedness, Polarized thinking and Thought constriction (tunnel vision)    Suicide Risk:  Minimal: No identifiable suicidal ideation.  Patients presenting with no risk factors but with morbid ruminations; may be classified as minimal risk based on the severity of the depressive symptoms  Principal Problem: Bipolar I disorder, most recent episode (or current) unspecified Discharge Diagnoses:  Patient Active Problem List   Diagnosis Date Noted  . Bipolar I disorder, most recent episode (or current) unspecified [F31.9] 08/15/2014  . Bipolar affective, mixed [F31.60] 08/14/2014  . Polysubstance abuse [F19.10]   . Substance induced mood disorder [F19.94] 05/07/2013  . Bipolar disorder, unspecified [F31.9] 05/07/2013  . Opiate abuse, continuous [F11.10] 05/07/2013  . Cannabis dependence [F12.20] 05/07/2013  Follow-up Information    Follow up with [facility].   Specialty:  Behavioral Health   Why:  Walk-in assessment for therapy and medication management  services Monday-Friday between 8am to 3pm.    Contact information:   [address] [place] [address] [phone]       Plan Of Care/Follow-up recommendations:  Activity:  as tolerated Diet:  regular Follow up [facility] as above Is patient on multiple antipsychotic therapies at discharge:  No   Has Patient had three or more failed trials of antipsychotic monotherapy by history:  No  Recommended Plan for Multiple Antipsychotic Therapies: NA    Ronee Ranganathan A 08/17/2014, 3:54 PM

## 2014-08-17 NOTE — Plan of Care (Signed)
Problem: Diagnosis: Increased Risk For Suicide Attempt Goal: STG-Patient Will Comply With Medication Regime Outcome: Progressing Client is compliant with medications regime and recognizes the efficacy of it AEB "they do help if I take them"

## 2014-08-17 NOTE — Progress Notes (Signed)
Recreation Therapy Notes  Animal-Assisted Activity (AAA) Program Checklist/Progress Notes Patient Eligibility Criteria Checklist & Daily Group note for Rec Tx Intervention  Date: 07.12.16 Time: 2:45 pm Location: [address] Dayroom   AAA/T Program Assumption of Risk Form signed by Patient/ or Parent Legal Guardian yes  Patient is free of allergies or sever asthma yes  Patient reports no fear of animals yes  Patient reports no history of cruelty to animalsyes  Patient understands his/her participation is voluntary yes  Patient washes hands before animal contact yes  Patient washes hands after animal contact yes  Education: Hand Washing, Appropriate Animal Interaction   Education Outcome: Acknowledges understanding/In group clarification offered/Needs additional education.   Clinical Observations/Feedback:  Patient did not attend group.   Yariana Hoaglund, LRT/CTRS         Willena Jeancharles A 08/17/2014 4:03 PM 
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
## 2014-08-17 NOTE — Tx Team (Signed)
Interdisciplinary Treatment Plan Update (Adult) Date: 08/17/2014   Time Reviewed: 9:30 AM  Progress in Treatment: Attending groups: Minimally Participating in groups: Minimally Taking medication as prescribed: Yes Tolerating medication: Yes Family/Significant other contact made: No, CSW assessing for appropriate contacts Patient understands diagnosis: Yes Discussing patient identified problems/goals with staff: Yes Medical problems stabilized or resolved: Yes Denies suicidal/homicidal ideation: Yes Issues/concerns per patient self-inventory: Yes Other:  New problem(s) identified: N/A  Discharge Plan or Barriers:   08/17/2014: Patient plans to return home to [facility]  Reason for Continuation of Hospitalization:  Depression Anxiety Medication Stabilization   Comments: N/A  Estimated length of stay: 1-2 days  For review of initial/current patient goals, please see plan of care. Patient is a 29 year old Caucasian Male IVC'd by mother for paranoia. Patient lives in [place]. He plans to return home to [facility]. He identifies his sister as a strong support. Patient will benefit from crisis stabilization, medication evaluation, group therapy, and psycho education in addition to case management for discharge planning. Patient and CSW reviewed pt's identified goals and treatment plan. Pt verbalized understanding and agreed to treatment plan.   Attendees: Patient:    Family:    Physician: Dr. Jama Flavorsobos; Dr. Dub MikesLugo 08/17/2014 9:30 AM  Nursing: Kathi SimpersSarah Twyman, Manuela SchwartzJennifer Pritchett, RN 08/17/2014 9:30 AM  Clinical Social Worker: Samuella BruinKristin Charls Custer,  LCSWA 08/17/2014 9:30 AM  Other: Chad CordialLauren Carter, LCSWA 08/17/2014 9:30 AM  Other: Leisa LenzValerie Enoch, [facility] Liaison 08/17/2014 9:30 AM  Other: Onnie BoerJennifer Clark, Case Manager 08/17/2014 9:30 AM  Other: Serena ColonelAggie Nwoko, NP 08/17/2014 9:30 AM  Other:    Other:    Other:    Other:     Other:     Scribe for Treatment Team:  Samuella BruinKristin Taylen Wendland, MSW, [employer]CSWA [phone]

## 2014-08-17 NOTE — Progress Notes (Signed)
Patient ID: James Walsh, male   DOB: [birth year], 29 y.o.   MRN: [id number]  Pt discharged home with his girlfriend. Pt to pick up his three sample medications tomorrow between 0830 and 1600. Telephone message left to notify [facility]. Pt was stable and appreciative at the time of discharge. All papers and prescriptions were given and valuables returned. Verbal understanding expressed. Denies SI/HI and A/VH. Pt given opportunity to express concerns and ask questions.

## 2014-08-17 NOTE — Progress Notes (Signed)
Patient ID: James Walsh, male   DOB: 04-Aug-1985, 29 y.o.   MRN: [id number] Adult Psychoeducational Group Note  Date:  08/17/2014 Time: 09:00am  Group Topic/Focus:  Recovery Goals:   The focus of this group is to identify appropriate goals for recovery and establish a plan to achieve them.  Participation Level:  Minimal  Participation Quality:  Attentive  Affect:  Flat  Cognitive:  Alert and Oriented  Insight: Lacking  Engagement in Group:  Lacking  Modes of Intervention:  Discussion, Education, Orientation and Support  Additional Comments:   Pt able to identify one sleep hygiene activity to utilize post discharge (decrease napping). Pt also to identify non-pharmacological coping skill to use in recovery.   [name], [name] E 08/17/2014, 10:13 AM

## 2014-08-17 NOTE — [facility] Suicide Risk Assessment (Signed)
[facility] INPATIENT:  Family/Significant Other Suicide Prevention Education  Suicide Prevention Education:  Education Completed; James Walsh [phone],  (name of family member/significant other) has been identified by the patient as the family member/significant other with whom the patient will be residing, and identified as the person(s) who will aid the patient in the event of a mental health crisis (suicidal ideations/suicide attempt).  With written consent from the patient, the family member/significant other has been provided the following suicide prevention education, prior to the and/or following the discharge of the patient.  The suicide prevention education provided includes the following:  Suicide risk factors  Suicide prevention and interventions  National Suicide Hotline telephone number  [facility] assessment telephone number  [facility] Emergency Assistance 911  [facility] and/or Residential Mobile Crisis Unit telephone number  Request made of family/significant other to:  Remove weapons (e.g., guns, rifles, knives), all items previously/currently identified as safety concern.    Remove drugs/medications (over-the-counter, prescriptions, illicit drugs), all items previously/currently identified as a safety concern.  The family member/significant other verbalizes understanding of the suicide prevention education information provided.  The family member/significant other agrees to remove the items of safety concern listed above.  James Walsh, James Walsh 08/17/2014, 4:40 PM and Patient Refusal for Family/Significant Other Suicide Prevention Education: The patient James Walsh has refused to provide written consent for family/significant other to be provided Family/Significant Other Suicide Prevention Education during admission and/or prior to discharge.  Physician notified.  James Walsh, James Walsh 08/17/2014, 4:40 PM

## 2014-08-17 NOTE — Progress Notes (Signed)
  [facility] Discharge Plan :  Will you be returning to the same living situation after discharge:  Yes,  Patient plans to return home At discharge, do you have transportation home?: Yes,  Patient reports access to transportation home Do you have the ability to pay for your medications: Yes,  patient will be provided with medication samples and prescriptions at discharge  Release of information consent forms completed and in the chart;  Patient's signature needed at discharge.  Patient to Follow up at: Follow-up Information    [facility] with [facility].   Specialty:  Behavioral Health   Why:  Walk-in assessment for therapy and medication management services Monday-Friday between 8am to 3pm.    Contact information:   [address] [place] [address] [phone]       Patient denies SI/HI: Yes,  denies    Safety Planning and Suicide Prevention discussed: Yes,  with patient and friend  Have you used any form of tobacco in the last 30 days? (Cigarettes, Smokeless Tobacco, Cigars, and/or Pipes): Patient Refused Screening  Has patient been referred to the Quitline?: Yes, referral faxed on 08/17/14  Jeanclaude Wentworth, [place] CarboKristin L 08/17/2014, 4:42 PM

## 2014-08-17 NOTE — [facility] Group Notes (Signed)
[facility] LCSW Group Therapy 08/17/2014 1:15 PM Type of Therapy: Group Therapy Participation Level: Active  Participation Quality: Attentive, Sharing and Supportive  Affect: Anxious  Cognitive: Alert and Oriented  Insight: Developing/Improving and Engaged  Engagement in Therapy: Developing/Improving and Engaged  Modes of Intervention: Activity, Clarification, Confrontation, Discussion, Education, Exploration, Limit-setting, Orientation, Problem-solving, Rapport Building, Dance movement psychotherapisteality Testing, Socialization and Support  Summary of Progress/Problems: Patient was attentive and engaged with speaker from [employer]. Patient was attentive to speaker while they shared their story of dealing with mental health and overcoming it. Patient expressed interest in their programs and services and received information on their agency. Patient processed ways they can relate to the speaker.   Samuella BruinKristin Yuvaan Olander, MSW, [employer]CSWA Clinical Social Worker [facility] [phone]

## 2014-08-17 NOTE — Plan of Care (Signed)
Problem: Alteration in mood & ability to function due to Goal: STG-Patient will attend groups Outcome: Progressing Pt attended groups today     

## 2014-08-17 NOTE — Progress Notes (Signed)
Patient ID: James Walsh, male   DOB: 04-29-1985, 29 y.o.   MRN: [id number]  Pt currently presents with a flat affect and cooperative, pleasant behavior. Per self inventory, pt rates depression at a 0, hopelessness 0 and anxiety 5. Pt's daily goal is to "go home." Pt has minimal interaction with staff member again today. Pt reports fair sleep, good concentration, high energy and a fair appetite.   Pt provided with medications per providers orders. Pt's labs and vitals were monitored throughout the day. Pt supported emotionally and encouraged to express concerns and questions. Pt educated on medications.  Pt's safety ensured with 15 minute and environmental checks. Pt currently denies SI/HI and A/V hallucinations. Pt verbally agrees to seek staff if SI/HI or A/VH occurs and to consult with staff before acting on these thoughts. Pt discharge plan to go home and "follow up with doctors." Will continue POC.

## 2014-08-18 LAB — HEMOGLOBIN A1C
HEMOGLOBIN A1C: 5.8 % — AB (ref 4.8–5.6)
Mean Plasma Glucose: 120 mg/dL

## 2014-08-18 LAB — HEPATITIS PANEL, ACUTE
HCV Ab: >11 s/co ratio — ABNORMAL HIGH (ref 0.0–0.9)
HEP B C IGM: NEGATIVE
Hep A IgM: NEGATIVE
Hepatitis B Surface Ag: NEGATIVE

## 2014-09-19 ENCOUNTER — Emergency Department (HOSPITAL_COMMUNITY): Payer: Self-pay

## 2014-09-19 ENCOUNTER — Encounter (HOSPITAL_COMMUNITY): Payer: Self-pay | Admitting: Emergency Medicine

## 2014-09-19 ENCOUNTER — Emergency Department (HOSPITAL_COMMUNITY)
Admission: EM | Admit: 2014-09-19 | Discharge: 2014-09-19 | Disposition: A | Discharge status: Home / Self Care | Payer: Self-pay | Attending: Emergency Medicine | Admitting: Emergency Medicine

## 2014-09-19 DIAGNOSIS — Z720 Tobacco use: Secondary | ICD-10-CM | POA: Insufficient documentation

## 2014-09-19 DIAGNOSIS — F319 Bipolar disorder, unspecified: Secondary | ICD-10-CM | POA: Insufficient documentation

## 2014-09-19 DIAGNOSIS — S62304A Unspecified fracture of fourth metacarpal bone, right hand, initial encounter for closed fracture: Secondary | ICD-10-CM | POA: Insufficient documentation

## 2014-09-19 DIAGNOSIS — Y9389 Activity, other specified: Secondary | ICD-10-CM | POA: Insufficient documentation

## 2014-09-19 DIAGNOSIS — Y998 Other external cause status: Secondary | ICD-10-CM | POA: Insufficient documentation

## 2014-09-19 DIAGNOSIS — Y9289 Other specified places as the place of occurrence of the external cause: Secondary | ICD-10-CM | POA: Insufficient documentation

## 2014-09-19 DIAGNOSIS — W228XXA Striking against or struck by other objects, initial encounter: Secondary | ICD-10-CM | POA: Insufficient documentation

## 2014-09-19 DIAGNOSIS — S62308A Unspecified fracture of other metacarpal bone, initial encounter for closed fracture: Secondary | ICD-10-CM

## 2014-09-19 DIAGNOSIS — S6991XA Unspecified injury of right wrist, hand and finger(s), initial encounter: Secondary | ICD-10-CM

## 2014-09-19 MED ORDER — TRAMADOL HCL 50 MG PO TABS: 50.0000 mg | ORAL_TABLET | Freq: Four times a day (QID) | ORAL | Status: DC | PRN

## 2014-09-19 MED ORDER — IBUPROFEN 200 MG PO TABS
400.0000 mg | ORAL_TABLET | Freq: Once | ORAL | Status: AC
  Administered 2014-09-19: 400 mg via ORAL
  Filled 2014-09-19: qty 2

## 2014-09-19 MED ORDER — IBUPROFEN 800 MG PO TABS: 800.0000 mg | ORAL_TABLET | Freq: Three times a day (TID) | ORAL | Status: DC
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
## 2014-09-19 NOTE — ED Notes (Signed)
Pt has small child with him, child is giving patient difficulties and asks if we can wait until mother gets back. Will call patient back here in a few minutes. Triage is delayed.

## 2014-09-19 NOTE — ED Provider Notes (Signed)
[name]: [id number]     Arrival date & time 09/19/14  1911 History  This chart was scribed for non-physician practitioner Allen Derry, PA-C working with Marily Memos, MD by Lyndel Safe, ED Scribe. This patient was seen in room WTR7/WTR7 and the patient's care was started at 7:33 PM.   Chief Complaint  Patient presents with  . Hand Injury   Patient is a 29 y.o. male presenting with hand injury. The history is provided by the patient. No language interpreter was used.  Hand Injury Location:  Hand Time since incident:  5 hours Injury: yes   Mechanism of injury comment:  Punched his car radio Hand location:  R hand Pain details:    Quality:  Throbbing   Radiates to:  Does not radiate   Severity:  Severe   Onset quality:  Sudden   Duration:  5 hours   Timing:  Constant   Progression:  Unchanged Chronicity:  New Handedness:  Right-handed Dislocation: no   Foreign body present:  No foreign bodies Prior injury to area:  No Relieved by:  Narcotics Worsened by:  Movement Ineffective treatments:  None tried Associated symptoms: swelling   Associated symptoms: no decreased range of motion, no muscle weakness, no numbness and no tingling   Risk factors: no frequent fractures    HPI Comments: James Walsh is a 29 y.o. male who presents to the Emergency Department complaining of sudden onset right hand pain over the 4-5th metacarpals s/p punching his car radio 4.5 hours ago. He describes his pain as a 10/10, throbbing, constant, non-radiating, worse with bending of his fingers, and mildly relieved by a percocet he took ~2hrs PTA. Pt is right handed. No prior injury or fracture to right hand. Pt denies a laceration to right hand, right wrist pain or decreased ROM in wrist, fevers, chills, nausea, vomiting, abdominal pain, CP, SOB, myalgias, hematuria, dysuria, numbness, tingling, or weakness.   Past Medical History  Diagnosis Date  . Mental disorder   . Bipolar disorder     Past Surgical History  Procedure Laterality Date  . Fracture surgery      right elbow  . Eye surgery Right     metal plate    No family history on file. Social History  Substance Use Topics  . Smoking status: Current Every Day Smoker -- 1.00 packs/day    Types: Cigarettes  . Smokeless tobacco: Current User  . Alcohol Use: No     Comment: occasional    Review of Systems  Respiratory: Negative for shortness of breath.   Cardiovascular: Negative for chest pain.  Gastrointestinal: Negative for nausea, vomiting and abdominal pain.  Genitourinary: Negative for dysuria and hematuria.  Musculoskeletal: Positive for joint swelling and arthralgias.  Skin: Positive for color change ( brusing ). Negative for wound.  Allergic/Immunologic: Negative for immunocompromised state.  Neurological: Negative for weakness and numbness.   A complete 10 system review of systems was obtained and is otherwise negative except at noted in the HPI and PMH.  Allergies  Review of patient's allergies indicates no known allergies.  Home Medications   Prior to Admission medications   Medication Sig Start Date End Date Taking? Authorizing Provider  hydrOXYzine (ATARAX/VISTARIL) 50 MG tablet Take 1 tablet (50 mg total) by mouth every 6 (six) hours as needed for anxiety. (sleep) 08/17/14   Sanjuana Kava, NP  OLANZapine zydis (ZYPREXA) 15 MG disintegrating tablet Take 1 tablet (15 mg total) by mouth at bedtime. For mood control  08/17/14   Sanjuana Kava, NP  traZODone (DESYREL) 50 MG tablet Take 1 tablet (50 mg total) by mouth at bedtime and may repeat dose one time if needed. For sleep 08/17/14   Sanjuana Kava, NP   BP 119/70 mmHg  Pulse 71  Temp(Src) 98.1 F (36.7 C) (Oral)  Resp 18  SpO2 97% Physical Exam  Constitutional: He is oriented to person, place, and time. Vital signs are normal. He appears well-developed and well-nourished.  Non-toxic appearance. No distress.  Afebrile, nontoxic, NAD  HENT:   Head: Normocephalic and atraumatic.  Mouth/Throat: Mucous membranes are normal.  Eyes: Conjunctivae and EOM are normal. Right eye exhibits no discharge. Left eye exhibits no discharge.  Neck: Normal range of motion. Neck supple.  Cardiovascular: Normal rate and intact distal pulses.   Pulmonary/Chest: Effort normal. No respiratory distress.  Abdominal: Normal appearance. He exhibits no distension.  Musculoskeletal:       Right hand: He exhibits decreased range of motion ( due to pain), tenderness, bony tenderness and swelling. He exhibits normal two-point discrimination, normal capillary refill, no deformity and no laceration. Normal sensation noted. Normal strength noted.       Hands: Right hand with TTP along the 4th and 5th metacarpals, with moderate swelling and slight bruising, skin intact, no warmth or erythema, with mildly diminished ROM due to pain at the 4-5th MCP joints although still able to flex/extend approx 50%, ROM intact at PIP and DIP joints of all digits. No gross deformity. Strength and sensation grossly intact. Distal pulses intact.   Neurological: He is alert and oriented to person, place, and time. He has normal strength. No sensory deficit.  Skin: Skin is warm, dry and intact. No rash noted.  Psychiatric: He has a normal mood and affect.  Nursing note and vitals reviewed.   ED Course  Procedures  DIAGNOSTIC STUDIES: Oxygen Saturation is 97% on RA, normal by my interpretation.    COORDINATION OF CARE: 7:39 PM Discussed treatment plan which includes to order Xray of right hand and ibuprofen with pt. Pt acknowledges and agrees to plan.   Labs Review Labs Reviewed - No data to display  Imaging Review No results found.   EKG Interpretation None      MDM   Final diagnoses:  Hand injury, right, initial encounter    29 y.o. male here with R hand injury after punching something in his car. NVI with soft compartments. Mild tenderness and swelling over 4-5th  metacarpals. Will obtain imaging. Pt took percocet PTA, will give ibuprofen here but won't give any narcotics here. Will reassess shortly.   8:00 PM Xray still pending. Will sign out care to [facility] PA-C at shift change. Please see her note for xray results and dispo planning.    I personally performed the services described in this documentation, which was scribed in my presence. The recorded information has been reviewed and is accurate.      Alpa Salvo Camprubi-Soms, PA-C 09/19/14 2000  Marily Memos, MD 09/21/14 [phone]

## 2014-09-19 NOTE — ED Provider Notes (Signed)
Patient care accepted from [facility], PA-C, with x-ray pending of left hand injury.   Imaging shows a boxer's fracture with approximately 80% angulation, closed injury, neurovascularly intact. Discussed with Dr. Melvyn Novas who advised splinting and office follow up tomorrow.   Patient has a well documented history of polysubstance abuse, specifically opiate abuse. Will provide Ultram and encourage ibuprofen use as well. Supportive care measures discussed. Ulnar gutter splint applied.   Elpidio Anis, PA-C 09/19/14 2032  Marily Memos, MD 09/21/14 [phone]

## 2014-09-19 NOTE — Discharge Instructions (Signed)
Boxer's Fracture You have a break (fracture) of the fifth metacarpal bone. This is commonly called a boxer's fracture. This is the bone in the hand where the little finger attaches. The fracture is in the end of that bone, closest to the little finger. It is usually caused when you hit an object with a clenched fist. Often, the knuckle is pushed down by the impact. Sometimes, the fracture rotates out of position. A boxer's fracture will usually heal within 6 weeks, if it is treated properly and protected from re-injury. Surgery is sometimes needed. A cast, splint, or bulky hand dressing may be used to protect and immobilize a boxer's fracture. Do not remove this device or dressing until your caregiver approves. Keep your hand elevated, and apply ice packs for 15-20 minutes every 2 hours, for the first 2 days. Elevation and ice help reduce swelling and relieve pain. See your caregiver, or an orthopedic specialist, for follow-up care within the next 10 days. This is to make sure your fracture is healing properly. Document Released: 01/22/2005 Document Revised: 04/16/2011 Document Reviewed: 07/12/2006 [facility]ExitCare Patient Information 2015 [place]ExitCare, MarylandLLC. This information is not intended to replace advice given to you by your health care provider. Make sure you discuss any questions you have with your health care provider. Cryotherapy Cryotherapy means treatment with cold. Ice or gel packs can be used to reduce both pain and swelling. Ice is the most helpful within the first 24 to 48 hours after an injury or flare-up from overusing a muscle or joint. Sprains, strains, spasms, burning pain, shooting pain, and aches can all be eased with ice. Ice can also be used when recovering from surgery. Ice is effective, has very few side effects, and is safe for most people to use. PRECAUTIONS  Ice is not a safe treatment option for people with:  Raynaud phenomenon. This is a condition affecting small blood vessels in the  extremities. Exposure to cold may cause your problems to return.  Cold hypersensitivity. There are many forms of cold hypersensitivity, including:  Cold urticaria. Red, itchy hives appear on the skin when the tissues begin to warm after being iced.  Cold erythema. This is a red, itchy rash caused by exposure to cold.  Cold hemoglobinuria. Red blood cells break down when the tissues begin to warm after being iced. The hemoglobin that carry oxygen are passed into the urine because they cannot combine with blood proteins fast enough.  Numbness or altered sensitivity in the area being iced. If you have any of the following conditions, do not use ice until you have discussed cryotherapy with your caregiver:  Heart conditions, such as arrhythmia, angina, or chronic heart disease.  High blood pressure.  Healing wounds or open skin in the area being iced.  Current infections.  Rheumatoid arthritis.  Poor circulation.  Diabetes. Ice slows the blood flow in the region it is applied. This is beneficial when trying to stop inflamed tissues from spreading irritating chemicals to surrounding tissues. However, if you expose your skin to cold temperatures for too long or without the proper protection, you can damage your skin or nerves. Watch for signs of skin damage due to cold. HOME CARE INSTRUCTIONS Follow these tips to use ice and cold packs safely.  Place a dry or damp towel between the ice and skin. A damp towel will cool the skin more quickly, so you may need to shorten the time that the ice is used.  For a more rapid response, add  gentle compression to the ice. °· Ice for no more than 10 to 20 minutes at a time. The bonier the area you are icing, the less time it will take to get the benefits of ice. °· Check your skin after 5 minutes to make sure there are no signs of a poor response to cold or skin damage. °· Rest 20 minutes or more between uses. °· Once your skin is numb, you can end your  treatment. You can test numbness by very lightly touching your skin. The touch should be so light that you do not see the skin dimple from the pressure of your fingertip. When using ice, most people will feel these normal sensations in this order: cold, burning, aching, and numbness. °· Do not use ice on someone who cannot communicate their responses to pain, such as small children or people with dementia. °HOW TO MAKE AN ICE PACK °Ice packs are the most common way to use ice therapy. Other methods include ice massage, ice baths, and cryosprays. Muscle creams that cause a cold, tingly feeling do not offer the same benefits that ice offers and should not be used as a substitute unless recommended by your caregiver. °To make an ice pack, do one of the following: °· Place crushed ice or a bag of frozen vegetables in a sealable plastic bag. Squeeze out the excess air. Place this bag inside another plastic bag. Slide the bag into a pillowcase or place a damp towel between your skin and the bag. °· Mix 3 parts water with 1 part rubbing alcohol. Freeze the mixture in a sealable plastic bag. When you remove the mixture from the freezer, it will be slushy. Squeeze out the excess air. Place this bag inside another plastic bag. Slide the bag into a pillowcase or place a damp towel between your skin and the bag. °SEEK MEDICAL CARE IF: °· You develop white spots on your skin. This may give the skin a blotchy (mottled) appearance. °· Your skin turns blue or pale. °· Your skin becomes waxy or hard. °· Your swelling gets worse. °MAKE SURE YOU:  °· Understand these instructions. °· Will watch your condition. °· Will get help right away if you are not doing well or get worse. °Document Released: 09/18/2010 Document Revised: 06/08/2013 Document Reviewed: 09/18/2010 °ExitCare® Patient Information ©2015 [employer]. This information is not intended to replace advice given to you by your health care provider. Make sure you discuss any  questions you have with your health care provider. ° ° °

## 2014-09-23 ENCOUNTER — Encounter (HOSPITAL_COMMUNITY): Payer: Self-pay | Admitting: Emergency Medicine

## 2014-09-23 ENCOUNTER — Emergency Department (INDEPENDENT_AMBULATORY_CARE_PROVIDER_SITE_OTHER)
Admission: EM | Admit: 2014-09-23 | Discharge: 2014-09-23 | Disposition: A | Discharge status: Home / Self Care | Payer: Self-pay | Source: Home / Self Care | Attending: Family Medicine | Admitting: Family Medicine

## 2014-09-23 DIAGNOSIS — S6291XS Unspecified fracture of right wrist and hand, sequela: Secondary | ICD-10-CM

## 2014-09-23 MED ORDER — DICLOFENAC POTASSIUM 50 MG PO TABS: 50.0000 mg | ORAL_TABLET | Freq: Three times a day (TID) | ORAL | Status: DC

## 2014-09-23 NOTE — Discharge Instructions (Signed)
Do not remove splint, use medicine as needed and ice pack, see orthopedist as soon as possible.

## 2014-09-23 NOTE — ED Provider Notes (Signed)
[name]: [id number]     Arrival date & time 09/23/14  1810 History   First MD Initiated Contact with Patient 09/23/14 1837     Chief Complaint  Patient presents with  . Hand Pain    right   (Consider location/radiation/quality/duration/timing/severity/associated sxs/prior Treatment) Patient is a 29 y.o. male presenting with hand pain. The history is provided by the patient.  Hand Pain This is a new problem. The current episode started more than 2 days ago (seen at [facility] on [facility]/14 with right hand boxers fx needing surg. pt does not have 250  for ofice f/u, here for pain med.). The problem has not changed since onset.Exacerbated by: removes splint for showering.    Past Medical History  Diagnosis Date  . Mental disorder   . Bipolar disorder    Past Surgical History  Procedure Laterality Date  . Fracture surgery      right elbow  . Eye surgery Right     metal plate    History reviewed. No pertinent family history. Social History  Substance Use Topics  . Smoking status: Current Every Day Smoker -- 1.00 packs/day    Types: Cigarettes  . Smokeless tobacco: Current User  . Alcohol Use: No     Comment: occasional    Review of Systems  Constitutional: Negative.   Musculoskeletal: Positive for joint swelling.  Skin: Negative.     Allergies  Review of patient's allergies indicates no known allergies.  Home Medications   Prior to Admission medications   Medication Sig Start Date End Date Taking? Authorizing Provider  traMADol (ULTRAM) 50 MG tablet Take 1 tablet (50 mg total) by mouth every 6 (six) hours as needed. 09/19/14  Yes Elpidio Anis, PA-C  diclofenac (CATAFLAM) 50 MG tablet Take 1 tablet (50 mg total) by mouth 3 (three) times daily. For hand pain 09/23/14   Linna Hoff, MD  hydrOXYzine (ATARAX/VISTARIL) 50 MG tablet Take 1 tablet (50 mg total) by mouth every 6 (six) hours as needed for anxiety. (sleep) Patient not taking: Reported on 09/19/2014 08/17/14   Sanjuana Kava, NP   ibuprofen (ADVIL,MOTRIN) 200 MG tablet Take 800 mg by mouth every 6 (six) hours as needed for moderate pain.    Historical Provider, MD  ibuprofen (ADVIL,MOTRIN) 800 MG tablet Take 1 tablet (800 mg total) by mouth 3 (three) times daily. 09/19/14   Elpidio Anis, PA-C  OLANZapine zydis (ZYPREXA) 15 MG disintegrating tablet Take 1 tablet (15 mg total) by mouth at bedtime. For mood control 08/17/14   Sanjuana Kava, NP  oxymetazoline (AFRIN) 0.05 % nasal spray Place 1 spray into both nostrils 2 (two) times daily as needed for congestion.    Historical Provider, MD  traZODone (DESYREL) 50 MG tablet Take 1 tablet (50 mg total) by mouth at bedtime and may repeat dose one time if needed. For sleep Patient not taking: Reported on 09/19/2014 08/17/14   Sanjuana Kava, NP   BP 100/68 mmHg  Pulse 71  Temp(Src) 98.8 F (37.1 C) (Oral)  Resp 20  SpO2 96% Physical Exam  Constitutional: He is oriented to person, place, and time. He appears well-developed and well-nourished.  Musculoskeletal:  In ulnar gutter splint ,nvt intact.  Neurological: He is alert and oriented to person, place, and time.  Skin: Skin is warm and dry.  Nursing note and vitals reviewed.   ED Course  Procedures (including critical care time) Labs Review Labs Reviewed - No data to display  Imaging Review No  results found.   MDM   1. Hand fracture, right, sequela        Linna Hoff, MD 09/23/14 [phone]

## 2014-09-23 NOTE — ED Notes (Signed)
Pt here for follow up of a right hand fracture he sustained a few days ago.  Pt still in temporary cast.  He cannot afford to follow up with the orthopedist.  Pt would like something for the pain.  Pt also told me he takes the cast off to shower.  I instructed pt not to do that anymore and that he needs to follow up immediately with the orthopedist as the hand will not heal correctly and he mentioned they told he may need surgery anyway.

## 2014-09-25 IMAGING — CT CT HEAD W/O CM
2 series · 16 of 30 positions shown, 20 images · non-contrast
Comparison: 1 day prior and 12/12/2005

CLINICAL DATA: Frontal headache and altered mental status.

CT HEAD WITHOUT CONTRAST
TECHNIQUE: Contiguous axial images were obtained from the base of
the skull through the vertex without contrast.

[Series 2: head w/o · axial · non-contrast · 0.44mm/px · z∈[+1171,+1291]mm · 13 of 28 slices shown, 17 images]
[im 2/28  brain]
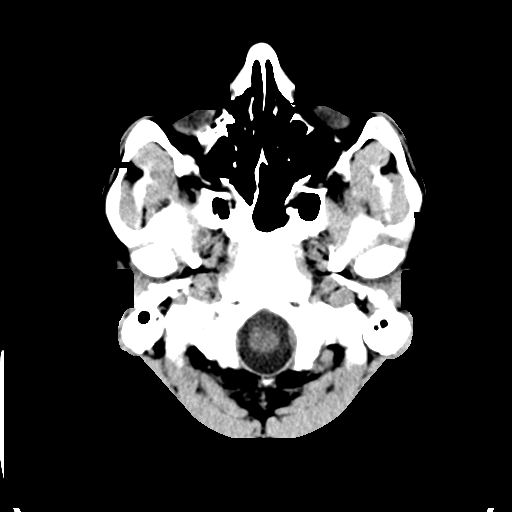
[im 2/28  bone]
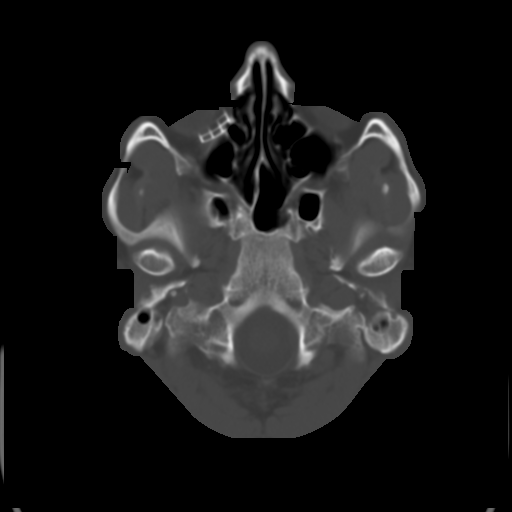
[im 4/28  brain]
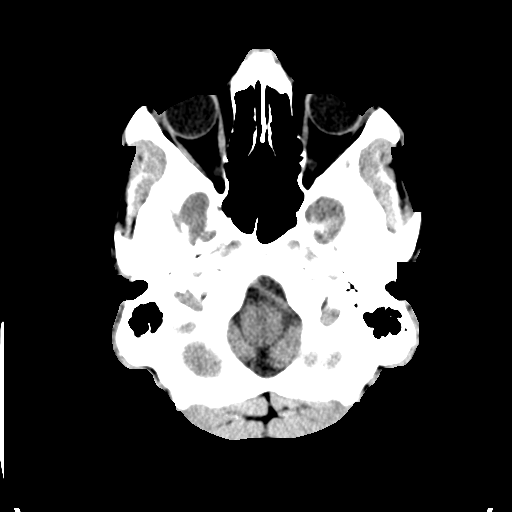
[im 6/28  brain]
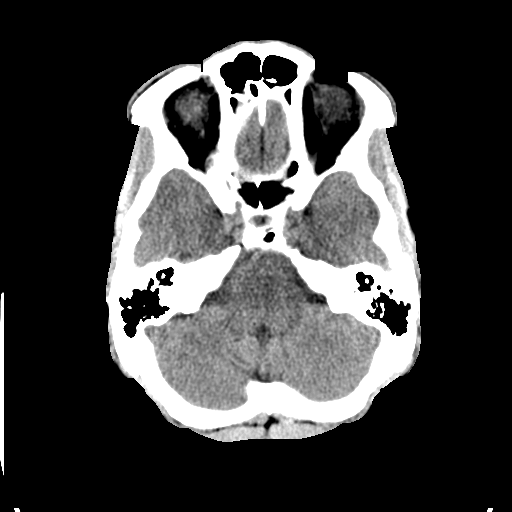
[im 8/28  brain]
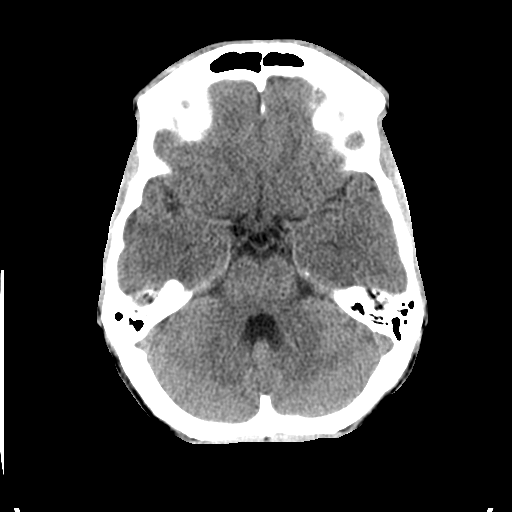
[im 10/28  brain]
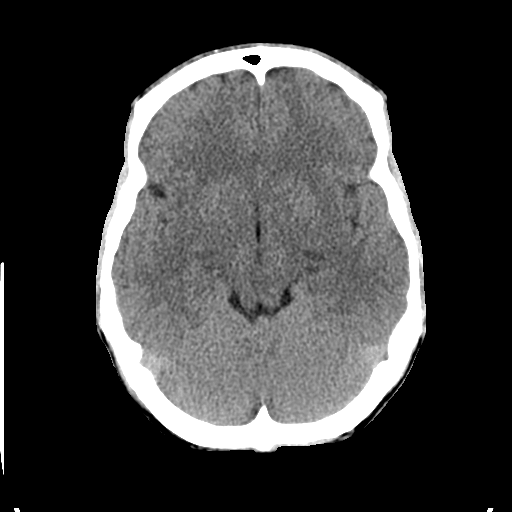
[im 10/28  bone]
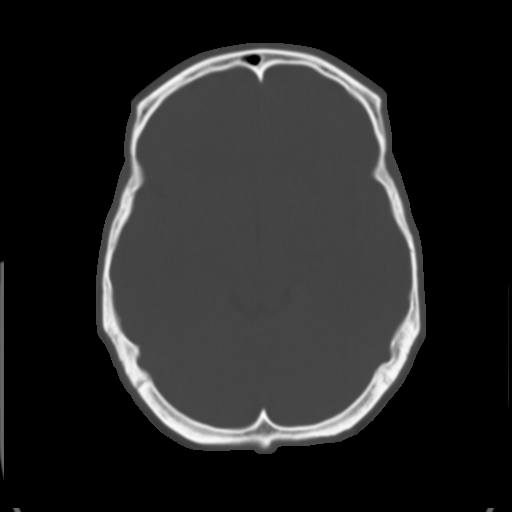
[im 12/28  brain]
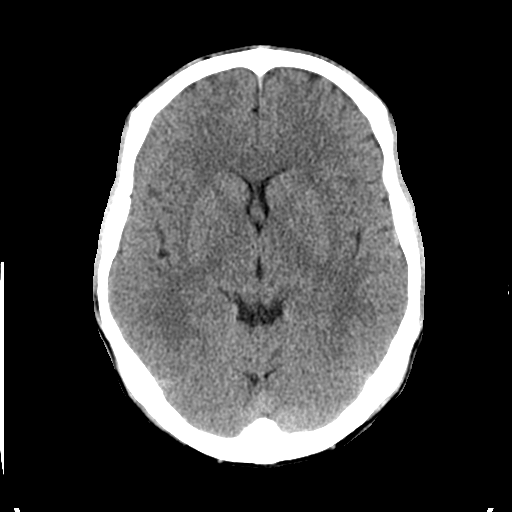
[im 14/28  brain]
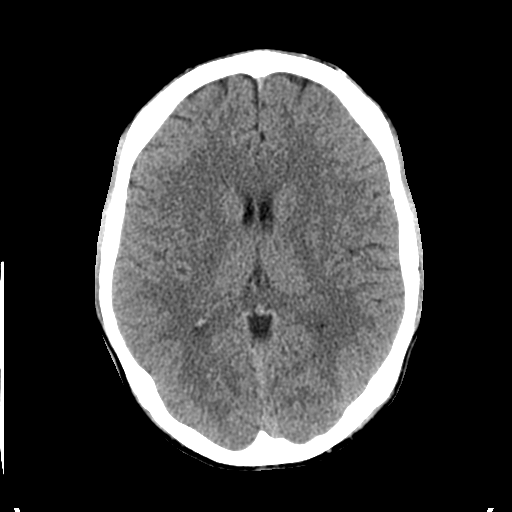
[im 16/28  brain]
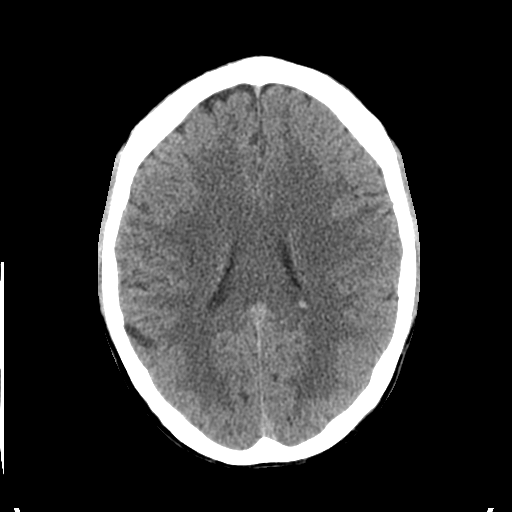
[im 18/28  brain]
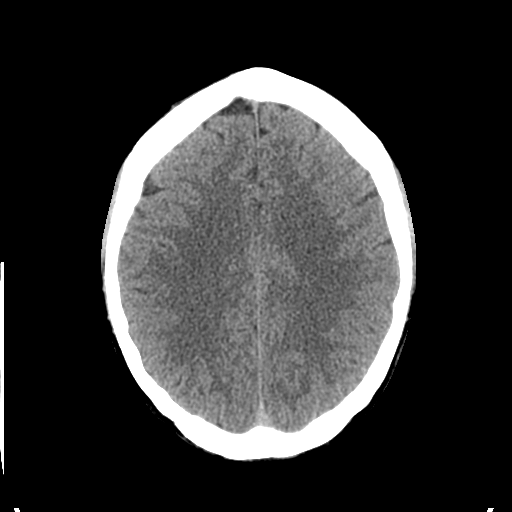
[im 18/28  bone]
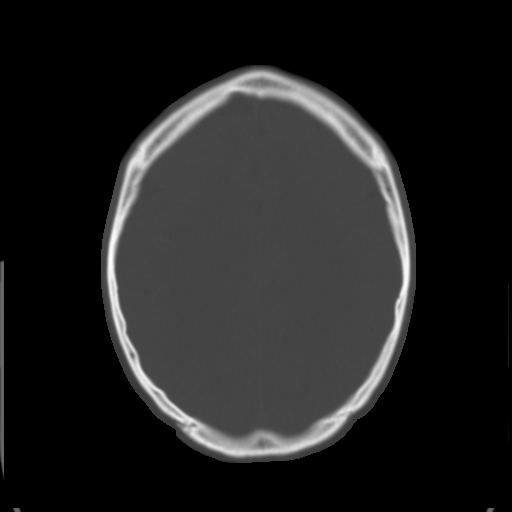
[im 20/28  brain]
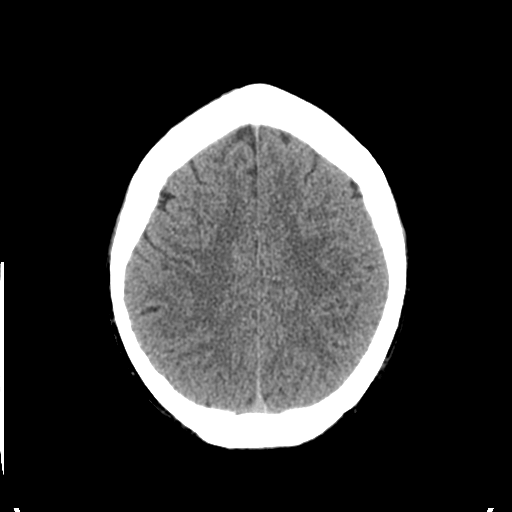
[im 22/28  brain]
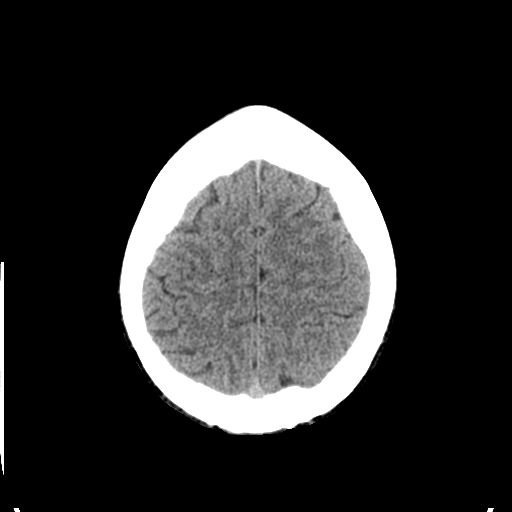
[im 24/28  brain]
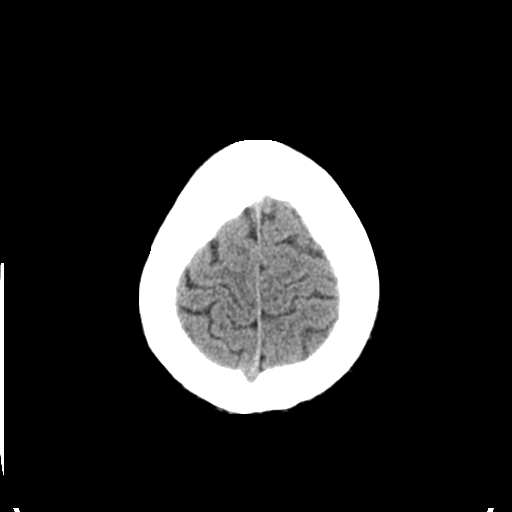
[im 26/28  brain]
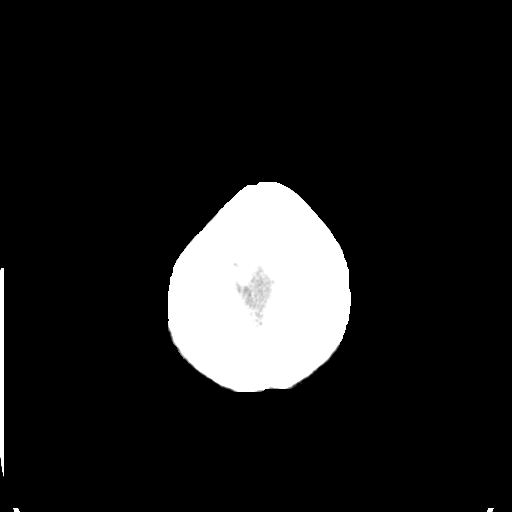
[im 26/28  bone]
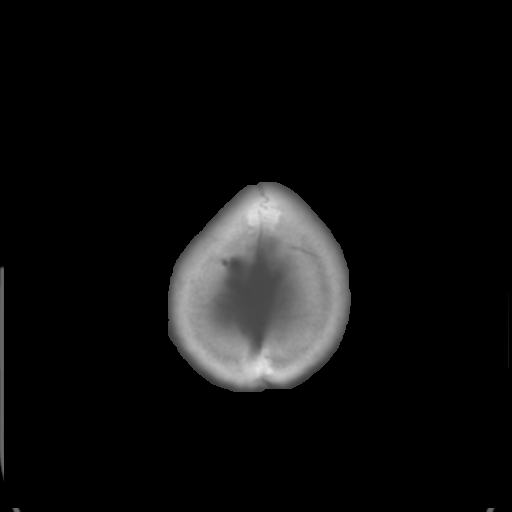

[Series 3: bone windows · axial · 0.44mm/px · z∈[+1171,+1211]mm · 3 of 28 slices shown]
[im 2/28  bone]
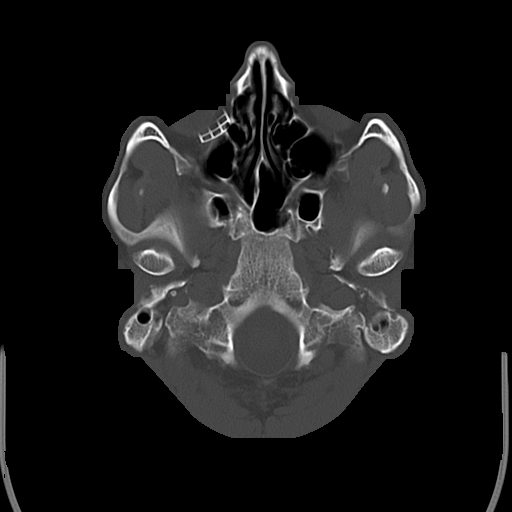
[im 6/28  bone]
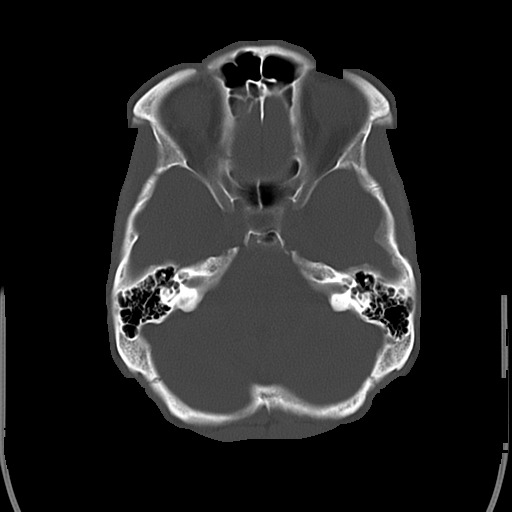
[im 10/28  bone]
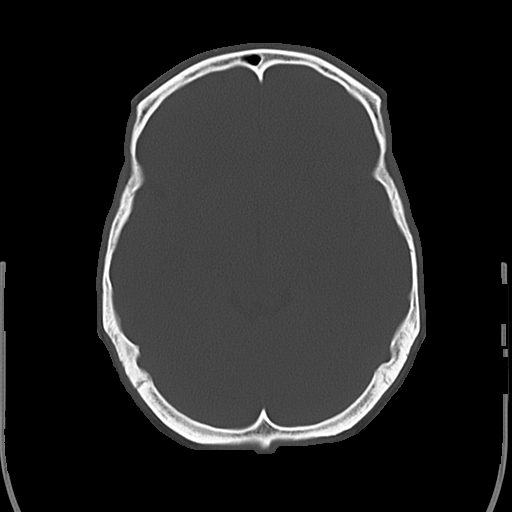

[16 of 30 positions shown; findings below may reference images not displayed]

FINDINGS: Bone windows demonstrate right orbital floor
reconstruction, as before.  Sinuses and mastoid air cells otherwise
unremarkable.

Soft tissue windows demonstrate no  mass lesion, hemorrhage,
hydrocephalus, acute infarct, intra-axial, or extra-axial fluid
collection.
IMPRESSION: 1. No acute intracranial abnormality.
2.  Right orbital floor reconstruction, as before.

## 2015-04-01 ENCOUNTER — Encounter (HOSPITAL_BASED_OUTPATIENT_CLINIC_OR_DEPARTMENT_OTHER): Payer: Self-pay

## 2015-04-01 ENCOUNTER — Emergency Department (HOSPITAL_BASED_OUTPATIENT_CLINIC_OR_DEPARTMENT_OTHER)
Admission: EM | Admit: 2015-04-01 | Discharge: 2015-04-01 | Disposition: A | Discharge status: Home / Self Care | Payer: BLUE CROSS/BLUE SHIELD | Attending: Emergency Medicine | Admitting: Emergency Medicine

## 2015-04-01 ENCOUNTER — Emergency Department (HOSPITAL_BASED_OUTPATIENT_CLINIC_OR_DEPARTMENT_OTHER): Payer: BLUE CROSS/BLUE SHIELD

## 2015-04-01 DIAGNOSIS — F319 Bipolar disorder, unspecified: Secondary | ICD-10-CM | POA: Diagnosis not present

## 2015-04-01 DIAGNOSIS — M1990 Unspecified osteoarthritis, unspecified site: Secondary | ICD-10-CM | POA: Diagnosis not present

## 2015-04-01 DIAGNOSIS — M533 Sacrococcygeal disorders, not elsewhere classified: Secondary | ICD-10-CM | POA: Insufficient documentation

## 2015-04-01 DIAGNOSIS — Z791 Long term (current) use of non-steroidal anti-inflammatories (NSAID): Secondary | ICD-10-CM | POA: Diagnosis not present

## 2015-04-01 DIAGNOSIS — F17210 Nicotine dependence, cigarettes, uncomplicated: Secondary | ICD-10-CM | POA: Insufficient documentation

## 2015-04-01 DIAGNOSIS — Z87828 Personal history of other (healed) physical injury and trauma: Secondary | ICD-10-CM | POA: Insufficient documentation

## 2015-04-01 DIAGNOSIS — M545 Low back pain: Secondary | ICD-10-CM | POA: Diagnosis not present

## 2015-04-01 DIAGNOSIS — G8929 Other chronic pain: Secondary | ICD-10-CM | POA: Diagnosis not present

## 2015-04-01 HISTORY — DX: Unspecified osteoarthritis, unspecified site: M19.90

## 2015-04-01 HISTORY — DX: Other chronic pain: G89.29

## 2015-04-01 HISTORY — DX: Dorsalgia, unspecified: M54.9

## 2015-04-01 MED ORDER — CYCLOBENZAPRINE HCL 10 MG PO TABS
10.0000 mg | ORAL_TABLET | Freq: Once | ORAL | Status: AC
  Administered 2015-04-01: 10 mg via ORAL
  Filled 2015-04-01: qty 1

## 2015-04-01 MED ORDER — NAPROXEN 250 MG PO TABS
500.0000 mg | ORAL_TABLET | Freq: Once | ORAL | Status: AC
  Administered 2015-04-01: 500 mg via ORAL
  Filled 2015-04-01: qty 2

## 2015-04-01 NOTE — ED Notes (Signed)
Pt states has hx of chronic back pain, states moved furniture and now having lower back pain; state took a oxycodone  yesterday morning
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
## 2015-04-01 NOTE — ED Notes (Signed)
Pt left before d/c papers were given; girlfriend at bedside states pt got mad at her and left.

## 2016-01-25 ENCOUNTER — Ambulatory Visit: Payer: Self-pay | Admitting: Student

## 2016-02-09 ENCOUNTER — Ambulatory Visit: Payer: Self-pay | Admitting: Student

## 2016-04-18 ENCOUNTER — Ambulatory Visit: Payer: Self-pay

## 2018-09-19 ENCOUNTER — Emergency Department (HOSPITAL_COMMUNITY)
Admission: EM | Admit: 2018-09-19 | Discharge: 2018-09-19 | Payer: BLUE CROSS/BLUE SHIELD | Attending: Emergency Medicine | Admitting: Emergency Medicine

## 2018-09-19 ENCOUNTER — Encounter (HOSPITAL_COMMUNITY): Payer: Self-pay

## 2018-09-19 ENCOUNTER — Other Ambulatory Visit: Payer: Self-pay

## 2018-09-19 DIAGNOSIS — Z79899 Other long term (current) drug therapy: Secondary | ICD-10-CM | POA: Insufficient documentation

## 2018-09-19 DIAGNOSIS — F17210 Nicotine dependence, cigarettes, uncomplicated: Secondary | ICD-10-CM | POA: Insufficient documentation

## 2018-09-19 DIAGNOSIS — F17220 Nicotine dependence, chewing tobacco, uncomplicated: Secondary | ICD-10-CM | POA: Insufficient documentation

## 2018-09-19 LAB — COMPREHENSIVE METABOLIC PANEL
ALT: 100 U/L — ABNORMAL HIGH (ref 0–44)
AST: 65 U/L — ABNORMAL HIGH (ref 15–41)
Albumin: 3.9 g/dL (ref 3.5–5.0)
Alkaline Phosphatase: 42 U/L (ref 38–126)
Anion gap: 10 (ref 5–15)
BUN: 16 mg/dL (ref 6–20)
CO2: 26 mmol/L (ref 22–32)
Calcium: 8.9 mg/dL (ref 8.9–10.3)
Chloride: 102 mmol/L (ref 98–111)
Creatinine, Ser: 1.22 mg/dL (ref 0.61–1.24)
GFR calc Af Amer: >60 mL/min (ref >60)
GFR calc non Af Amer: >60 mL/min (ref >60)
Glucose, Bld: 98 mg/dL (ref 70–99)
Potassium: 4.5 mmol/L (ref 3.5–5.1)
Sodium: 138 mmol/L (ref 135–145)
Total Bilirubin: 0.7 mg/dL (ref 0.3–1.2)
Total Protein: 7.2 g/dL (ref 6.5–8.1)

## 2018-09-19 LAB — VALPROIC ACID LEVEL: Valproic Acid Lvl: 56 ug/mL (ref 50.0–100.0)

## 2018-09-19 LAB — CBC WITH DIFFERENTIAL/PLATELET
Abs Immature Granulocytes: 0.04 10*3/uL (ref 0.00–0.07)
Basophils Absolute: 0 10*3/uL (ref 0.0–0.1)
Basophils Relative: 0 %
Eosinophils Absolute: 0 10*3/uL (ref 0.0–0.5)
Eosinophils Relative: 0 %
HCT: 42.7 % (ref 39.0–52.0)
Hemoglobin: 14.3 g/dL (ref 13.0–17.0)
Immature Granulocytes: 1 %
Lymphocytes Relative: 15 %
Lymphs Abs: 1.2 10*3/uL (ref 0.7–4.0)
MCH: 32.4 pg (ref 26.0–34.0)
MCHC: 33.5 g/dL (ref 30.0–36.0)
MCV: 96.8 fL (ref 80.0–100.0)
Monocytes Absolute: 0.9 10*3/uL (ref 0.1–1.0)
Monocytes Relative: 11 %
Neutro Abs: 5.9 10*3/uL (ref 1.7–7.7)
Neutrophils Relative %: 73 %
Platelets: 142 10*3/uL — ABNORMAL LOW (ref 150–400)
RBC: 4.41 MIL/uL (ref 4.22–5.81)
RDW: 13.2 % (ref 11.5–15.5)
WBC: 8.2 10*3/uL (ref 4.0–10.5)
nRBC: 0 % (ref 0.0–0.2)

## 2018-09-19 LAB — RAPID URINE DRUG SCREEN, HOSP PERFORMED
Amphetamines: NOT DETECTED
Barbiturates: NOT DETECTED
Benzodiazepines: NOT DETECTED
Cocaine: NOT DETECTED
Opiates: NOT DETECTED
Tetrahydrocannabinol: NOT DETECTED

## 2018-09-19 NOTE — ED Notes (Addendum)
Spoke with Junious Silk at the jail [phone]. Per [facility], pt has been on depakote for his time at the jail. Approximately 2 weeks ago, pt started hallucinating. Jail was concerned about Depakote toxicity, as he was on a higher dose. Pt was refusing labs, so the jail cut his dose in half. Pt continues to not be acting himself per jail staff. Jail staff requesting depakote levels, CBC with diff, CMP, along with "anything we recommend"

## 2018-09-19 NOTE — ED Provider Notes (Signed)
I assumed care of patient from James Robinson PA-C at shift change, briefly patient is here from jail for labs and concern for questionable hallucinations.  Per jail patient has been on Depakote for his approximate 2 years that he has been at the jail.  2 weeks ago he started hallucinating in the jail was reportedly concerned about Depakote toxicity and they are requesting labs.  [facility] staff is requesting CBC with differential, CMP and Depakote levels.  Physical Exam  BP 125/85   Pulse 96   Temp 98.7 F (37.1 C) (Oral)   Resp 14   Ht 5\' 8"  (1.727 m)   Wt 79.4 kg   SpO2 99%   BMI 26.61 kg/m   Physical Exam Vitals signs and nursing note reviewed.  Constitutional:      General: He is not in acute distress.    Appearance: He is well-developed. He is not diaphoretic.  HENT:     Head: Normocephalic and atraumatic.  Eyes:     General: No scleral icterus.       Right eye: No discharge.        Left eye: No discharge.     Conjunctiva/sclera: Conjunctivae normal.  Neck:     Musculoskeletal: Normal range of motion.  Cardiovascular:     Rate and Rhythm: Normal rate.  Pulmonary:     Effort: Pulmonary effort is normal. No respiratory distress.     Breath sounds: No stridor.  Abdominal:     General: There is no distension.  Neurological:     General: No focal deficit present.     Mental Status: He is alert and oriented to person, place, and time.     Motor: No abnormal muscle tone.  Psychiatric:     Comments: Does not appear to be responding to internal stimuli.  Affect is flat.     ED Course/Procedures     Procedures   Labs Reviewed  CBC WITH DIFFERENTIAL/PLATELET - Abnormal; Notable for the following components:      Result Value   Platelets 142 (*)    All other components within normal limits  COMPREHENSIVE METABOLIC PANEL - Abnormal; Notable for the following components:   AST 65 (*)    ALT 100 (*)    All other components within normal limits  VALPROIC ACID LEVEL  RAPID  URINE DRUG SCREEN, [place] to follow-up on labs, if normal/consistent with baseline anticipate discharge home. His valproic acid level is 56, not consistent with Depakote toxicity.  Patient has reportedly been noncompliant with his Remeron and Lamictal since last month.    His AST and ALT are both slightly high at 65 and 100 respectively however this is improved from his previous labs.  His platelets are slightly low at 142 which appears to be new however I do not suspect that this is contributing to his symptoms.  Remainder of labs are unremarkable.  UDS is negative.  I recommended to patient that he resume compliance with his medications.    Return precautions were discussed with patient who states their understanding.  At the time of discharge patient denied any unaddressed complaints or concerns.  Patient is agreeable for discharge back to jail.         Lorin Glass, PA-C 09/19/18 1842    Julianne Rice, MD 09/20/18 [phone]

## 2018-09-19 NOTE — ED Triage Notes (Addendum)
Pt BIBA from jail. Pt was sent over for labs. Jail did not want to wait 3-5 days for results.  Officer says pt has not been taking meds- unsure of what meds. Pt at [facility] [address]

## 2018-09-19 NOTE — Discharge Instructions (Addendum)
Today your Depakote level was 56 ug/ml which is on the lower end.   Your liver functions are slightly elevated, however this appears improved from your previous labs.  Your platelets are slightly low at 142.  The remainder of your labs were reassuring.  Your symptoms may be due to refusing your other medications.

## 2018-09-19 NOTE — ED Notes (Signed)
Pt provided pepsi

## 2018-09-19 NOTE — ED Notes (Signed)
LVM for [facility] [phone]. Requesting provider is Corliss Blacker, NP at [facility].
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
## 2018-09-19 NOTE — ED Notes (Addendum)
Paperwork from jail states altered mental status on ER/Direct Admit Referral Request. Attempting to call now [phone]

## 2020-10-23 ENCOUNTER — Other Ambulatory Visit: Payer: Self-pay

## 2020-10-23 ENCOUNTER — Inpatient Hospital Stay (HOSPITAL_COMMUNITY)
Admission: EM | Admit: 2020-10-23 | Discharge: 2020-10-27 | DRG: 917 | Attending: Family Medicine | Admitting: Family Medicine

## 2020-10-23 ENCOUNTER — Emergency Department (HOSPITAL_COMMUNITY)

## 2020-10-23 DIAGNOSIS — J690 Pneumonitis due to inhalation of food and vomit: Secondary | ICD-10-CM | POA: Diagnosis present

## 2020-10-23 DIAGNOSIS — E872 Acidosis: Secondary | ICD-10-CM | POA: Diagnosis present

## 2020-10-23 DIAGNOSIS — F1910 Other psychoactive substance abuse, uncomplicated: Secondary | ICD-10-CM | POA: Diagnosis present

## 2020-10-23 DIAGNOSIS — T40605A Adverse effect of unspecified narcotics, initial encounter: Secondary | ICD-10-CM | POA: Diagnosis present

## 2020-10-23 DIAGNOSIS — G8929 Other chronic pain: Secondary | ICD-10-CM | POA: Diagnosis present

## 2020-10-23 DIAGNOSIS — L299 Pruritus, unspecified: Secondary | ICD-10-CM | POA: Diagnosis not present

## 2020-10-23 DIAGNOSIS — T402X4A Poisoning by other opioids, undetermined, initial encounter: Secondary | ICD-10-CM

## 2020-10-23 DIAGNOSIS — Z8249 Family history of ischemic heart disease and other diseases of the circulatory system: Secondary | ICD-10-CM

## 2020-10-23 DIAGNOSIS — B1920 Unspecified viral hepatitis C without hepatic coma: Secondary | ICD-10-CM | POA: Diagnosis present

## 2020-10-23 DIAGNOSIS — E875 Hyperkalemia: Secondary | ICD-10-CM | POA: Diagnosis present

## 2020-10-23 DIAGNOSIS — F319 Bipolar disorder, unspecified: Secondary | ICD-10-CM | POA: Diagnosis present

## 2020-10-23 DIAGNOSIS — G928 Other toxic encephalopathy: Secondary | ICD-10-CM | POA: Diagnosis present

## 2020-10-23 DIAGNOSIS — T402X1A Poisoning by other opioids, accidental (unintentional), initial encounter: Secondary | ICD-10-CM | POA: Diagnosis not present

## 2020-10-23 DIAGNOSIS — Z79899 Other long term (current) drug therapy: Secondary | ICD-10-CM

## 2020-10-23 DIAGNOSIS — Z79891 Long term (current) use of opiate analgesic: Secondary | ICD-10-CM

## 2020-10-23 DIAGNOSIS — E876 Hypokalemia: Secondary | ICD-10-CM | POA: Diagnosis not present

## 2020-10-23 DIAGNOSIS — M1990 Unspecified osteoarthritis, unspecified site: Secondary | ICD-10-CM | POA: Diagnosis present

## 2020-10-23 DIAGNOSIS — R739 Hyperglycemia, unspecified: Secondary | ICD-10-CM | POA: Diagnosis present

## 2020-10-23 DIAGNOSIS — Z20822 Contact with and (suspected) exposure to covid-19: Secondary | ICD-10-CM | POA: Diagnosis present

## 2020-10-23 DIAGNOSIS — J9601 Acute respiratory failure with hypoxia: Secondary | ICD-10-CM | POA: Diagnosis present

## 2020-10-23 DIAGNOSIS — I468 Cardiac arrest due to other underlying condition: Secondary | ICD-10-CM | POA: Diagnosis present

## 2020-10-23 DIAGNOSIS — T50904A Poisoning by unspecified drugs, medicaments and biological substances, undetermined, initial encounter: Secondary | ICD-10-CM | POA: Diagnosis present

## 2020-10-23 DIAGNOSIS — F17210 Nicotine dependence, cigarettes, uncomplicated: Secondary | ICD-10-CM | POA: Diagnosis present

## 2020-10-23 DIAGNOSIS — I469 Cardiac arrest, cause unspecified: Secondary | ICD-10-CM | POA: Diagnosis not present

## 2020-10-23 DIAGNOSIS — N179 Acute kidney failure, unspecified: Secondary | ICD-10-CM | POA: Diagnosis present

## 2020-10-23 LAB — LACTIC ACID, PLASMA
Lactic Acid, Venous: 2.9 mmol/L (ref 0.5–1.9)
Lactic Acid, Venous: 1.3 mmol/L (ref 0.5–1.9)

## 2020-10-23 LAB — CBC WITH DIFFERENTIAL/PLATELET
Abs Immature Granulocytes: 0.1 10*3/uL — ABNORMAL HIGH (ref 0.00–0.07)
Basophils Absolute: 0 10*3/uL (ref 0.0–0.1)
Basophils Relative: 0 %
Eosinophils Absolute: 0.1 10*3/uL (ref 0.0–0.5)
Eosinophils Relative: 1 %
HCT: 45.2 % (ref 39.0–52.0)
Hemoglobin: 15.3 g/dL (ref 13.0–17.0)
Immature Granulocytes: 1 %
Lymphocytes Relative: 9 %
Lymphs Abs: 1.2 10*3/uL (ref 0.7–4.0)
MCH: 31.7 pg (ref 26.0–34.0)
MCHC: 33.8 g/dL (ref 30.0–36.0)
MCV: 93.8 fL (ref 80.0–100.0)
Monocytes Absolute: 0.4 10*3/uL (ref 0.1–1.0)
Monocytes Relative: 3 %
Neutro Abs: 11.1 10*3/uL — ABNORMAL HIGH (ref 1.7–7.7)
Neutrophils Relative %: 86 %
Platelets: 220 10*3/uL (ref 150–400)
RBC: 4.82 MIL/uL (ref 4.22–5.81)
RDW: 12.5 % (ref 11.5–15.5)
WBC: 12.9 10*3/uL — ABNORMAL HIGH (ref 4.0–10.5)
nRBC: 0 % (ref 0.0–0.2)

## 2020-10-23 LAB — RAPID URINE DRUG SCREEN, HOSP PERFORMED
Amphetamines: NOT DETECTED
Barbiturates: NOT DETECTED
Benzodiazepines: NOT DETECTED
Cocaine: NOT DETECTED
Opiates: POSITIVE — AB
Tetrahydrocannabinol: NOT DETECTED

## 2020-10-23 LAB — I-STAT CHEM 8, ED
BUN: 9 mg/dL (ref 6–20)
Calcium, Ion: 1.14 mmol/L — ABNORMAL LOW (ref 1.15–1.40)
Chloride: 99 mmol/L (ref 98–111)
Creatinine, Ser: 1.5 mg/dL — ABNORMAL HIGH (ref 0.61–1.24)
Glucose, Bld: 194 mg/dL — ABNORMAL HIGH (ref 70–99)
HCT: 45 % (ref 39.0–52.0)
Hemoglobin: 15.3 g/dL (ref 13.0–17.0)
Potassium: 4 mmol/L (ref 3.5–5.1)
Sodium: 139 mmol/L (ref 135–145)
TCO2: 27 mmol/L (ref 22–32)

## 2020-10-23 LAB — COMPREHENSIVE METABOLIC PANEL
ALT: 120 U/L — ABNORMAL HIGH (ref 0–44)
AST: 65 U/L — ABNORMAL HIGH (ref 15–41)
Albumin: 4 g/dL (ref 3.5–5.0)
Alkaline Phosphatase: 37 U/L — ABNORMAL LOW (ref 38–126)
Anion gap: 11 (ref 5–15)
BUN: 9 mg/dL (ref 6–20)
CO2: 24 mmol/L (ref 22–32)
Calcium: 8.8 mg/dL — ABNORMAL LOW (ref 8.9–10.3)
Chloride: 102 mmol/L (ref 98–111)
Creatinine, Ser: 1.69 mg/dL — ABNORMAL HIGH (ref 0.61–1.24)
GFR, Estimated: 54 mL/min — ABNORMAL LOW (ref >60)
Glucose, Bld: 194 mg/dL — ABNORMAL HIGH (ref 70–99)
Potassium: 4 mmol/L (ref 3.5–5.1)
Sodium: 137 mmol/L (ref 135–145)
Total Bilirubin: 0.8 mg/dL (ref 0.3–1.2)
Total Protein: 6.7 g/dL (ref 6.5–8.1)

## 2020-10-23 LAB — TROPONIN I (HIGH SENSITIVITY)
Troponin I (High Sensitivity): 22 ng/L — ABNORMAL HIGH (ref <18)
Troponin I (High Sensitivity): 62 ng/L — ABNORMAL HIGH (ref <18)

## 2020-10-23 MED ORDER — ACETAMINOPHEN 325 MG PO TABS
650.0000 mg | ORAL_TABLET | Freq: Four times a day (QID) | ORAL | Status: DC | PRN
  Administered 2020-10-24 – 2020-10-27 (×5): 650 mg via ORAL
  Filled 2020-10-23 (×5): qty 2

## 2020-10-23 MED ORDER — ACETAMINOPHEN 650 MG RE SUPP: 650.0000 mg | Freq: Four times a day (QID) | RECTAL | Status: DC | PRN

## 2020-10-23 MED ORDER — ONDANSETRON HCL 4 MG/2ML IJ SOLN
4.0000 mg | Freq: Four times a day (QID) | INTRAMUSCULAR | Status: DC | PRN
  Administered 2020-10-23: 4 mg via INTRAVENOUS
  Filled 2020-10-23: qty 2

## 2020-10-23 MED ORDER — ENOXAPARIN SODIUM 40 MG/0.4ML IJ SOSY
40.0000 mg | PREFILLED_SYRINGE | Freq: Every day | INTRAMUSCULAR | Status: DC
  Administered 2020-10-24: 40 mg via SUBCUTANEOUS
  Filled 2020-10-23: qty 0.4

## 2020-10-23 MED ORDER — IOHEXOL 350 MG/ML SOLN
120.0000 mL | Freq: Once | INTRAVENOUS | Status: AC | PRN
  Administered 2020-10-23: 120 mL via INTRAVENOUS

## 2020-10-23 MED ORDER — ONDANSETRON HCL 4 MG PO TABS: 4.0000 mg | ORAL_TABLET | Freq: Four times a day (QID) | ORAL | Status: DC | PRN

## 2020-10-23 MED ORDER — LACTATED RINGERS IV BOLUS
1000.0000 mL | Freq: Once | INTRAVENOUS | Status: AC
  Administered 2020-10-23: 1000 mL via INTRAVENOUS

## 2020-10-23 MED ORDER — INSULIN ASPART 100 UNIT/ML IJ SOLN
0.0000 [IU] | Freq: Three times a day (TID) | INTRAMUSCULAR | Status: DC
Start: 2020-10-24 — End: 2020-10-25

## 2020-10-23 NOTE — H&P (Addendum)
History and Physical    [name] IRS:[id number] DOB: [birth year] DOA: 10/23/2020  PCP: Maple Hudson., MD  Patient coming from: Prison  I have personally briefly reviewed patient's old medical records in [facility]  Chief Complaint: Cardiac arrest  HPI: James Walsh is a 35 y.o. male with medical history significant of B[employer] 1, polysubstance abuse including opiates.  Pt is incarcerated.  Pt presents to ED for evaluation of cardiac arrest.  Pt found unresponsive and hypoxic in his cell.  Nursing staff at facility preformed CPR for ~15 mins, pt has ROSC.  Pt given Narcan at some point during resuscitation.  ED Course: Pt arrives to ED AAO and answering questions, though he is persistently hypoxic on RA, improved on O2 via [place].  UDS positive for opiates.  Pt not prescribed any opiates per PMP aware nor supposed to be on any according to his med rec from prison.  Pt denies ODing on anything, denies opiate OD, says "what are opiates".  Worth noting though that pt has known h/o prior opiate abuse documented in system including being on suboxone from [place] in the past.  Trops: 22 and 62.  Lactate 2.9 and 1.3.  WBC 12.9k  BGL 194, no h/o DM  Creat 1.69 up from 1.22 in 2020.  CTA neg for PE.  Does have RLL infiltrate vs atelectasis.  Review of Systems: As per HPI, otherwise all review of systems negative.  Past Medical History:  Diagnosis Date   Arthritis    Bipolar disorder (HCC)    Chronic back pain    Mental disorder     Past Surgical History:  Procedure Laterality Date   EYE SURGERY Right    metal plate    FRACTURE SURGERY     right elbow     reports that he has been smoking cigarettes. He has been smoking an average of 1 pack per day. He uses smokeless tobacco. He reports current drug use. Drug: Oxycodone. He reports that he does not drink alcohol.  No Known Allergies  Family History  Problem Relation Age of Onset   Heart attack  Paternal Grandfather      Prior to Admission medications   Medication Sig Start Date End Date Taking? Authorizing Provider  busPIRone (BUSPAR) 30 MG tablet Take 30 mg by mouth 2 (two) times daily.   Yes [provider]  cholecalciferol (VITAMIN D) 25 MCG (1000 UNIT) tablet Take 1,000 Units by mouth daily.   Yes [provider]  escitalopram (LEXAPRO) 20 MG tablet Take 20 mg by mouth at bedtime.   Yes [provider]  ziprasidone (GEODON) 40 MG capsule Take 80 mg by mouth 2 (two) times daily with a meal.   Yes [provider]  diclofenac (CATAFLAM) 50 MG tablet Take 1 tablet (50 mg total) by mouth 3 (three) times daily. For hand pain Patient not taking: Reported on 10/23/2020 09/23/14   Linna Hoff, MD  hydrOXYzine (ATARAX/VISTARIL) 50 MG tablet Take 1 tablet (50 mg total) by mouth every 6 (six) hours as needed for anxiety. (sleep) Patient not taking: No sig reported 08/17/14   Armandina Stammer I, NP  ibuprofen (ADVIL,MOTRIN) 800 MG tablet Take 1 tablet (800 mg total) by mouth 3 (three) times daily. Patient not taking: Reported on 10/23/2020 09/19/14   Elpidio Anis, PA-C  OLANZapine zydis (ZYPREXA) 15 MG disintegrating tablet Take 1 tablet (15 mg total) by mouth at bedtime. For mood control Patient not taking: Reported  on 10/23/2020 08/17/14   Armandina Stammer I, NP  traMADol (ULTRAM) 50 MG tablet Take 1 tablet (50 mg total) by mouth every 6 (six) hours as needed. Patient not taking: Reported on 10/23/2020 09/19/14   Elpidio Anis, PA-C  traZODone (DESYREL) 50 MG tablet Take 1 tablet (50 mg total) by mouth at bedtime and may repeat dose one time if needed. For sleep Patient not taking: No sig reported 08/17/14   Armandina Stammer I, NP    Physical Exam: Vitals:   10/23/20 2015 10/23/20 2115 10/23/20 2130 10/23/20 2300  BP: 125/79 (!) 134/109 126/85 (!) 133/95  Pulse: (!) 105 (!) 106 (!) 107 100  Resp: 12 (!) 9 16 11   Temp:      TempSrc:      SpO2: (!) 83% 95% 91% 95%     Constitutional: NAD, calm, comfortable Eyes: PERRL, lids and conjunctivae normal ENMT: Mucous membranes are moist. Posterior pharynx clear of any exudate or lesions.Normal dentition.  Neck: normal, supple, no masses, no thyromegaly Respiratory: clear to auscultation bilaterally. Normal respiratory effort. No accessory muscle use.  Cardiovascular: Tachycardic Abdomen: no tenderness, no masses palpated. No hepatosplenomegaly. Bowel sounds positive.  Musculoskeletal: no clubbing / cyanosis. No joint deformity upper and lower extremities. Good ROM, no contractures. Normal muscle tone.  Skin: no rashes, lesions, ulcers. No induration Neurologic: CN 2-12 grossly intact. Sensation intact, DTR normal. Strength 5/5 in all 4.  Psychiatric: Normal judgment and insight. Alert and oriented x 3. Normal mood.    Labs on Admission: I have personally reviewed following labs and imaging studies  CBC: Recent Labs  Lab 10/23/20 1919 10/23/20 1935  WBC 12.9*  --   NEUTROABS 11.1*  --   HGB 15.3 15.3  HCT 45.2 45.0  MCV 93.8  --   PLT 220  --    Basic Metabolic Panel: Recent Labs  Lab 10/23/20 1919 10/23/20 1935  NA 137 139  K 4.0 4.0  CL 102 99  CO2 24  --   GLUCOSE 194* 194*  BUN 9 9  CREATININE 1.69* 1.50*  CALCIUM 8.8*  --    GFR: CrCl cannot be calculated (Unknown ideal weight.). Liver Function Tests: Recent Labs  Lab 10/23/20 1919  AST 65*  ALT 120*  ALKPHOS 37*  BILITOT 0.8  PROT 6.7  ALBUMIN 4.0   No results for input(s): LIPASE, AMYLASE in the last 168 hours. No results for input(s): AMMONIA in the last 168 hours. Coagulation Profile: No results for input(s): INR, PROTIME in the last 168 hours. Cardiac Enzymes: No results for input(s): CKTOTAL, CKMB, CKMBINDEX, TROPONINI in the last 168 hours. BNP (last 3 results) No results for input(s): PROBNP in the last 8760 hours. HbA1C: No results for input(s): HGBA1C in the last 72 hours. CBG: No results for input(s):  GLUCAP in the last 168 hours. Lipid Profile: No results for input(s): CHOL, HDL, LDLCALC, TRIG, CHOLHDL, LDLDIRECT in the last 72 hours. Thyroid Function Tests: No results for input(s): TSH, T4TOTAL, FREET4, T3FREE, THYROIDAB in the last 72 hours. Anemia Panel: No results for input(s): VITAMINB12, FOLATE, FERRITIN, TIBC, IRON, RETICCTPCT in the last 72 hours. Urine analysis: No results found for: COLORURINE, APPEARANCEUR, LABSPEC, PHURINE, GLUCOSEU, HGBUR, BILIRUBINUR, KETONESUR, PROTEINUR, UROBILINOGEN, NITRITE, LEUKOCYTESUR  Radiological Exams on Admission: CT HEAD WO CONTRAST (10/25/20)  Result Date: 10/23/2020 CLINICAL DATA:  Mental status change of unknown cause. Cardiac arrest. Patient was found unresponsive and purple. 15 minutes of CPR. EXAM: CT HEAD WITHOUT CONTRAST TECHNIQUE: Contiguous axial images  were obtained from the base of the skull through the vertex without intravenous contrast. COMPARISON:  11/04/2011 FINDINGS: Brain: No evidence of acute infarction, hemorrhage, hydrocephalus, extra-axial collection or mass lesion/mass effect. Gray-white matter junctions are distinct. Vascular: No hyperdense vessel or unexpected calcification. Skull: Calvarium appears intact. Sinuses/Orbits: Postoperative changes in the right inferior orbital rim. Paranasal sinuses and mastoid air cells are clear. Other: None. IMPRESSION: No acute intracranial abnormalities. Electronically Signed   By: Burman Nieves M.D.   On: 10/23/2020 22:25   CT Angio Chest PE W and/or Wo Contrast  Result Date: 10/23/2020 CLINICAL DATA:  Rule out pulmonary embolus. Patient was found unresponsive and purple. CPR for 15 minutes. EXAM: CT ANGIOGRAPHY CHEST WITH CONTRAST TECHNIQUE: Multidetector CT imaging of the chest was performed using the standard protocol during bolus administration of intravenous contrast. Multiplanar CT image reconstructions and MIPs were obtained to evaluate the vascular anatomy. CONTRAST:  OMNIPAQUE  IOHEXOL 350 MG/ML SOLN COMPARISON:  Chest radiograph 10/23/2020 FINDINGS: Cardiovascular: Good opacification of the central and segmental pulmonary arteries. Mixing artifact is demonstrated. No focal filling defects. No evidence of significant pulmonary embolus. Normal heart size. No pericardial effusions. Normal caliber thoracic aorta. No dissection. Great vessel origins are patent. Mediastinum/Nodes: Esophagus is mostly decompressed. Subcentimeter nodule in the left thyroid. No follow-up indicated by size criteria. No significant lymphadenopathy. Lungs/Pleura: Motion artifact limits evaluation. There is evidence of linear infiltration or atelectasis in the right lung base. No pleural effusions. No pneumothorax. Upper Abdomen: No acute abnormalities demonstrated in the visualized upper abdomen. Musculoskeletal: No chest wall abnormality. No acute or significant osseous findings. Focal areas of benign-appearing sclerosis in several vertebrae and ribs. Review of the MIP images confirms the above findings. IMPRESSION: 1. No evidence of significant pulmonary embolus. 2. Atelectasis or infiltration in the right lung base. Electronically Signed   By: Burman Nieves M.D.   On: 10/23/2020 22:30   DG Chest Portable 1 View  Result Date: 10/23/2020 CLINICAL DATA:  Post CPR. EXAM: PORTABLE CHEST 1 VIEW COMPARISON:  Chest x-ray 05/14/2008. FINDINGS: The cardiomediastinal silhouette is within normal limits. There is no focal lung consolidation, pleural effusion or pneumothorax. Small sclerotic density in the posterior left 6 rib appears unchanged. No acute fractures are identified. IMPRESSION: No active disease. Electronically Signed   By: Darliss Cheney M.D.   On: 10/23/2020 19:52    EKG: Independently reviewed.  Assessment/Plan Principal Problem:   Cardiac arrest [facility]) Active Problems:   Polysubstance abuse (HCC)   Bipolar I disorder (HCC)   Hyperglycemia   Overdose, undetermined intent, initial encounter    AKI (acute kidney injury) (HCC)   Aspiration pneumonia of right lower lobe (HCC)   Acute respiratory failure with hypoxia [facility])    S/p Cardiac arrest, CPR, ROSC - Thankfully pt already AAO on arrival to ED Strong suspicion is that this was due to opiate OD though pt denies taking any opiates, or even knowing what opiates are, his UDS is positive for opiates, he has known h/o polysubstance including opiate abuse, treatment with opiate replacement [facility] at [facility] previously. its not surprising that he would deny use / OD while incarcerated, especially while sitting in front of law enforcement, as I believe possession of substances / contraband in prison is actually a felony. Got Narcan with CPR Tele monitor 2d echo Serial trops Acute resp failure with hypoxia - Patient has acute respiratory failure with hypoxia due to having a new oxygen requirement.  That is the patient has a PaO2 <  60 (pulse Ox < 90%) on room air. Suspect developing RLL aspiration PNA seen on CT RLL aspiration PNA - CT findings in setting of recent CPR, WBC elevation, hypoxia, and s.tach 2L LR bolus then 125 cc/hr for S.Tach BCx ordered and pending Unasyn empirically COVID pending AKI - Mild AKI, likely secondary to #1 IVF Repeat BMP in AM Hyperglycemia - No h/o DM Sensitive SSI AC/HS A1C pending. BPD - Cont geodon Cont lexapro  DVT prophylaxis: Lovenox Code Status: Full Family Communication: Patent examiner at bedside Disposition Plan: Back to prison after medically stable Consults called: None Admission status: Place in obs    Rithy Mandley M. DO [facility]  How to contact the [facility] Attending or Consulting provider 7A - 7P or covering provider during after hours 7P -7A, for this patient?  Check the care team in [facility] and look for a) attending/consulting [facility] provider listed and b) the [facility] team listed Log into [email or web address]  [employer] Physician Scheduling and messaging for groups and whole  hospitals  On call and physician scheduling software for group practices, residents, hospitalists and other medical providers for call, clinic, rotation and shift schedules. On[employer] is a hospital-wide system for scheduling doctors and paging doctors on call. EasyPlot is for scientific plotting and data analysis.  [email or web address]  and use [facility]'s universal password to access. If you do not have the password, please contact the hospital operator.  Locate the [facility] provider you are looking for under [facility] and page to a number that you can be directly reached. If you still have difficulty reaching the provider, please page the [facility] (Director on Call) for the Hospitalists listed on amion for assistance.  10/23/2020, 11:56 PM

## 2020-10-23 NOTE — ED Provider Notes (Signed)
[facility] EMERGENCY DEPARTMENT Provider Note   [name]: [id number] Arrival date & time: 10/23/20  1856     History No chief complaint on file.   James Walsh is a 35 y.o. male with PMH bipolar disorder, polysubstance abuse who presents the emergency department for evaluation of cardiac arrest.  Patient is currently incarcerated and was found unresponsive and hypoxic in his cell.  Nursing staff at the facility perform CPR for approximately 15 minutes and patient had return of spontaneous circulation.  He was given Narcan at some point during his resuscitation.  He arrives alert and oriented answering all questions appropriately and hemodynamically stable.  He is persistently hypoxic on room air with initial O2 sats 87%.  HPI     Past Medical History:  Diagnosis Date   Arthritis    Bipolar disorder (HCC)    Chronic back pain    Mental disorder     Patient Active Problem List   Diagnosis Date Noted   Cardiac arrest (HCC) 10/23/2020   Hyperglycemia 10/23/2020   Overdose, undetermined intent, initial encounter 10/23/2020   Bipolar I disorder (HCC) 08/15/2014   Bipolar affective, mixed (HCC) 08/14/2014   Polysubstance abuse (HCC)    Substance induced mood disorder (HCC) 05/07/2013   Bipolar disorder, unspecified (HCC) 05/07/2013   Opiate abuse, continuous (HCC) 05/07/2013   Cannabis dependence (HCC) 05/07/2013    Past Surgical History:  Procedure Laterality Date   EYE SURGERY Right    metal plate    FRACTURE SURGERY     right elbow       No family history on file.  Social History   Tobacco Use   Smoking status: Every Day    Packs/day: 1.00    Types: Cigarettes   Smokeless tobacco: Current  Substance Use Topics   Alcohol use: No    Comment: occasional   Drug use: Yes    Types: Oxycodone    Comment: 08/13/2014 denies drugs, uses pills mentioned in traige note __ denies 05/01/14 --(05/2013)admitts to using methadone this am took 2 10mg   dose  of meth., xnanx 1/2mg  last time used was 1-2weeks.    Home Medications Prior to Admission medications   Medication Sig Start Date End Date Taking? Authorizing Provider  busPIRone (BUSPAR) 30 MG tablet Take 30 mg by mouth 2 (two) times daily.   Yes [provider]  cholecalciferol (VITAMIN D) 25 MCG (1000 UNIT) tablet Take 1,000 Units by mouth daily.   Yes [provider]  escitalopram (LEXAPRO) 20 MG tablet Take 20 mg by mouth at bedtime.   Yes [provider]  ziprasidone (GEODON) 40 MG capsule Take 80 mg by mouth 2 (two) times daily with a meal.   Yes [provider]  diclofenac (CATAFLAM) 50 MG tablet Take 1 tablet (50 mg total) by mouth 3 (three) times daily. For hand pain Patient not taking: Reported on 10/23/2020 09/23/14   09/25/14, MD  hydrOXYzine (ATARAX/VISTARIL) 50 MG tablet Take 1 tablet (50 mg total) by mouth every 6 (six) hours as needed for anxiety. (sleep) Patient not taking: No sig reported 08/17/14   10/18/14 I, NP  ibuprofen (ADVIL,MOTRIN) 800 MG tablet Take 1 tablet (800 mg total) by mouth 3 (three) times daily. Patient not taking: Reported on 10/23/2020 09/19/14   09/21/14, PA-C  OLANZapine zydis (ZYPREXA) 15 MG disintegrating tablet Take 1 tablet (15 mg total) by mouth at bedtime. For mood control Patient not taking: Reported on 10/23/2020 08/17/14  Armandina Stammer I, NP  traMADol (ULTRAM) 50 MG tablet Take 1 tablet (50 mg total) by mouth every 6 (six) hours as needed. Patient not taking: Reported on 10/23/2020 09/19/14   Elpidio Anis, PA-C  traZODone (DESYREL) 50 MG tablet Take 1 tablet (50 mg total) by mouth at bedtime and may repeat dose one time if needed. For sleep Patient not taking: No sig reported 08/17/14   Sanjuana Kava, NP    Allergies    Patient has no known allergies.  Review of Systems   Review of Systems  Constitutional:  Negative for chills and fever.  HENT:  Negative for ear pain and sore throat.   Eyes:   Negative for pain and visual disturbance.  Respiratory:  Positive for shortness of breath. Negative for cough.   Cardiovascular:  Negative for chest pain and palpitations.  Gastrointestinal:  Negative for abdominal pain and vomiting.  Genitourinary:  Negative for dysuria and hematuria.  Musculoskeletal:  Negative for arthralgias and back pain.  Skin:  Negative for color change and rash.  Neurological:  Positive for syncope. Negative for seizures.  All other systems reviewed and are negative.  Physical Exam Updated Vital Signs BP (!) 133/95   Pulse 100   Temp 98.8 F (37.1 C) (Oral)   Resp 11   SpO2 95%   Physical Exam Vitals and nursing note reviewed.  Constitutional:      Appearance: He is well-developed.  HENT:     Head: Normocephalic and atraumatic.  Eyes:     Conjunctiva/sclera: Conjunctivae normal.  Cardiovascular:     Rate and Rhythm: Normal rate and regular rhythm.     Heart sounds: No murmur heard. Pulmonary:     Effort: Pulmonary effort is normal. No respiratory distress.     Breath sounds: Normal breath sounds.  Abdominal:     Palpations: Abdomen is soft.     Tenderness: There is no abdominal tenderness.  Musculoskeletal:     Cervical back: Neck supple.  Skin:    General: Skin is warm and dry.  Neurological:     Mental Status: He is alert.    ED Results / Procedures / Treatments   Labs (all labs ordered are listed, but only abnormal results are displayed) Labs Reviewed  COMPREHENSIVE METABOLIC PANEL - Abnormal; Notable for the following components:      Result Value   Glucose, Bld 194 (*)    Creatinine, Ser 1.69 (*)    Calcium 8.8 (*)    AST 65 (*)    ALT 120 (*)    Alkaline Phosphatase 37 (*)    GFR, Estimated 54 (*)    All other components within normal limits  LACTIC ACID, PLASMA - Abnormal; Notable for the following components:   Lactic Acid, Venous 2.9 (*)    All other components within normal limits  CBC WITH DIFFERENTIAL/PLATELET -  Abnormal; Notable for the following components:   WBC 12.9 (*)    Neutro Abs 11.1 (*)    Abs Immature Granulocytes 0.10 (*)    All other components within normal limits  RAPID URINE DRUG SCREEN, HOSP PERFORMED - Abnormal; Notable for the following components:   Opiates POSITIVE (*)    All other components within normal limits  I-STAT CHEM 8, ED - Abnormal; Notable for the following components:   Creatinine, Ser 1.50 (*)    Glucose, Bld 194 (*)    Calcium, Ion 1.14 (*)    All other components within normal limits  TROPONIN I (  HIGH SENSITIVITY) - Abnormal; Notable for the following components:   Troponin I (High Sensitivity) 22 (*)    All other components within normal limits  TROPONIN I (HIGH SENSITIVITY) - Abnormal; Notable for the following components:   Troponin I (High Sensitivity) 62 (*)    All other components within normal limits  CULTURE, BLOOD (ROUTINE X 2)  CULTURE, BLOOD (ROUTINE X 2)  SARS CORONAVIRUS 2 (TAT 6-24 HRS)  LACTIC ACID, PLASMA  BLOOD GAS, VENOUS  HIV ANTIBODY (ROUTINE TESTING W REFLEX)  CBC  BASIC METABOLIC PANEL  HEMOGLOBIN A1C  TROPONIN I (HIGH SENSITIVITY)    EKG None  Radiology CT HEAD WO CONTRAST ( )  Result Date: 10/23/2020 CLINICAL DATA:  Mental status change of unknown cause. Cardiac arrest. Patient was found unresponsive and purple. 15 minutes of CPR. EXAM: CT HEAD WITHOUT CONTRAST TECHNIQUE: Contiguous axial images were obtained from the base of the skull through the vertex without intravenous contrast. COMPARISON:  11/04/2011 FINDINGS: Brain: No evidence of acute infarction, hemorrhage, hydrocephalus, extra-axial collection or mass lesion/mass effect. Gray-white matter junctions are distinct. Vascular: No hyperdense vessel or unexpected calcification. Skull: Calvarium appears intact. Sinuses/Orbits: Postoperative changes in the right inferior orbital rim. Paranasal sinuses and mastoid air cells are clear. Other: None. IMPRESSION: No acute  intracranial abnormalities. Electronically Signed   By: Burman Nieves M.D.   On: 10/23/2020 22:25   CT Angio Chest PE W and/or Wo Contrast  Result Date: 10/23/2020 CLINICAL DATA:  Rule out pulmonary embolus. Patient was found unresponsive and purple. CPR for 15 minutes. EXAM: CT ANGIOGRAPHY CHEST WITH CONTRAST TECHNIQUE: Multidetector CT imaging of the chest was performed using the standard protocol during bolus administration of intravenous contrast. Multiplanar CT image reconstructions and MIPs were obtained to evaluate the vascular anatomy. CONTRAST:  OMNIPAQUE IOHEXOL 350 MG/ML SOLN COMPARISON:  Chest radiograph 10/23/2020 FINDINGS: Cardiovascular: Good opacification of the central and segmental pulmonary arteries. Mixing artifact is demonstrated. No focal filling defects. No evidence of significant pulmonary embolus. Normal heart size. No pericardial effusions. Normal caliber thoracic aorta. No dissection. Great vessel origins are patent. Mediastinum/Nodes: Esophagus is mostly decompressed. Subcentimeter nodule in the left thyroid. No follow-up indicated by size criteria. No significant lymphadenopathy. Lungs/Pleura: Motion artifact limits evaluation. There is evidence of linear infiltration or atelectasis in the right lung base. No pleural effusions. No pneumothorax. Upper Abdomen: No acute abnormalities demonstrated in the visualized upper abdomen. Musculoskeletal: No chest wall abnormality. No acute or significant osseous findings. Focal areas of benign-appearing sclerosis in several vertebrae and ribs. Review of the MIP images confirms the above findings. IMPRESSION: 1. No evidence of significant pulmonary embolus. 2. Atelectasis or infiltration in the right lung base. Electronically Signed   By: Burman Nieves M.D.   On: 10/23/2020 22:30   DG Chest Portable 1 View  Result Date: 10/23/2020 CLINICAL DATA:  Post CPR. EXAM: PORTABLE CHEST 1 VIEW COMPARISON:  Chest x-ray 05/14/2008. FINDINGS:  The cardiomediastinal silhouette is within normal limits. There is no focal lung consolidation, pleural effusion or pneumothorax. Small sclerotic density in the posterior left 6 rib appears unchanged. No acute fractures are identified. IMPRESSION: No active disease. Electronically Signed   By: Darliss Cheney M.D.   On: 10/23/2020 19:52    Procedures Procedures   Medications Ordered in ED Medications  enoxaparin (LOVENOX) injection 40 mg (has no administration in time range)  acetaminophen (TYLENOL) tablet 650 mg (has no administration in time range)    Or  acetaminophen (TYLENOL) suppository 650 mg (  has no administration in time range)  ondansetron (ZOFRAN) tablet 4 mg ( Oral See Alternative 10/23/20 2345)    Or  ondansetron (ZOFRAN) injection 4 mg (4 mg Intravenous Given 10/23/20 2345)  insulin aspart (novoLOG) injection 0-9 Units (has no administration in time range)  lactated ringers bolus 1,000 mL (1,000 mLs Intravenous New Bag/Given 10/23/20 2153)  iohexol (OMNIPAQUE) 350 MG/ML injection 120 mL (120 mLs Intravenous Contrast Given 10/23/20 2217)    ED Course  I have reviewed the triage vital signs and the nursing notes.  Pertinent labs & imaging results that were available during my care of the patient were reviewed by me and considered in my medical decision making (see chart for details).    MDM Rules/Calculators/A&P                           Patient seen emergency department for evaluation of suspected cardiac arrest.  Physical exam is unremarkable.  Patient complaining of chest pain likely secondary to compressions.  ECG with sinus tachycardia but otherwise unremarkable and nonischemic.  Laboratory evaluation with leukocytosis 12.9, creatinine elevated to 1.5, initial troponin 22, delta troponin 62.  UDS positive for opioids.  CT head, CT PE and chest x-ray unremarkable.  Patient require admission for uptrending troponin and cardiac arrest likely secondary to opioid overdose.  Patient  is hemodynamically stable and safe for the floor.  Patient with medicine. Final Clinical Impression(s) / ED Diagnoses Final diagnoses:  Cardiac arrest (HCC)  Opioid overdose, undetermined intent, initial encounter [facility])    Rx / DC Orders ED Discharge Orders     None        Donyae Kilner, MD 10/23/20 2350

## 2020-10-23 NOTE — ED Notes (Signed)
Provider made aware of blood glucose

## 2020-10-23 NOTE — ED Triage Notes (Signed)
Per EMS pt coming from jail. Pt found unresponsive and purple. Pt had CPR that went for . Pt got 4mg   of narcan at the jail. No shock. Pt was being bagged by jail staff before EMS arrived.   No cardiac meds. AOx4  VS with EMS 115 150/100 99% RA  Pt denies any drug use.

## 2020-10-24 ENCOUNTER — Encounter (HOSPITAL_COMMUNITY): Payer: Self-pay | Admitting: Internal Medicine

## 2020-10-24 ENCOUNTER — Observation Stay (HOSPITAL_COMMUNITY)

## 2020-10-24 ENCOUNTER — Other Ambulatory Visit (HOSPITAL_COMMUNITY): Payer: Self-pay

## 2020-10-24 ENCOUNTER — Inpatient Hospital Stay (HOSPITAL_COMMUNITY)

## 2020-10-24 DIAGNOSIS — G8929 Other chronic pain: Secondary | ICD-10-CM | POA: Diagnosis present

## 2020-10-24 DIAGNOSIS — E875 Hyperkalemia: Secondary | ICD-10-CM | POA: Diagnosis present

## 2020-10-24 DIAGNOSIS — I469 Cardiac arrest, cause unspecified: Secondary | ICD-10-CM

## 2020-10-24 DIAGNOSIS — N179 Acute kidney failure, unspecified: Secondary | ICD-10-CM | POA: Diagnosis present

## 2020-10-24 DIAGNOSIS — F319 Bipolar disorder, unspecified: Secondary | ICD-10-CM | POA: Diagnosis present

## 2020-10-24 DIAGNOSIS — Z8249 Family history of ischemic heart disease and other diseases of the circulatory system: Secondary | ICD-10-CM | POA: Diagnosis not present

## 2020-10-24 DIAGNOSIS — J9601 Acute respiratory failure with hypoxia: Secondary | ICD-10-CM

## 2020-10-24 DIAGNOSIS — E872 Acidosis: Secondary | ICD-10-CM | POA: Diagnosis present

## 2020-10-24 DIAGNOSIS — F1910 Other psychoactive substance abuse, uncomplicated: Secondary | ICD-10-CM | POA: Diagnosis present

## 2020-10-24 DIAGNOSIS — I468 Cardiac arrest due to other underlying condition: Secondary | ICD-10-CM | POA: Diagnosis present

## 2020-10-24 DIAGNOSIS — R739 Hyperglycemia, unspecified: Secondary | ICD-10-CM | POA: Diagnosis present

## 2020-10-24 DIAGNOSIS — E876 Hypokalemia: Secondary | ICD-10-CM | POA: Diagnosis not present

## 2020-10-24 DIAGNOSIS — G928 Other toxic encephalopathy: Secondary | ICD-10-CM | POA: Diagnosis present

## 2020-10-24 DIAGNOSIS — T402X1A Poisoning by other opioids, accidental (unintentional), initial encounter: Secondary | ICD-10-CM | POA: Diagnosis present

## 2020-10-24 DIAGNOSIS — F17210 Nicotine dependence, cigarettes, uncomplicated: Secondary | ICD-10-CM | POA: Diagnosis present

## 2020-10-24 DIAGNOSIS — J690 Pneumonitis due to inhalation of food and vomit: Secondary | ICD-10-CM | POA: Diagnosis present

## 2020-10-24 DIAGNOSIS — M1990 Unspecified osteoarthritis, unspecified site: Secondary | ICD-10-CM | POA: Diagnosis present

## 2020-10-24 DIAGNOSIS — Z20822 Contact with and (suspected) exposure to covid-19: Secondary | ICD-10-CM | POA: Diagnosis present

## 2020-10-24 DIAGNOSIS — T40605A Adverse effect of unspecified narcotics, initial encounter: Secondary | ICD-10-CM | POA: Diagnosis present

## 2020-10-24 DIAGNOSIS — Z79891 Long term (current) use of opiate analgesic: Secondary | ICD-10-CM | POA: Diagnosis not present

## 2020-10-24 DIAGNOSIS — L299 Pruritus, unspecified: Secondary | ICD-10-CM | POA: Diagnosis not present

## 2020-10-24 DIAGNOSIS — B1920 Unspecified viral hepatitis C without hepatic coma: Secondary | ICD-10-CM | POA: Diagnosis present

## 2020-10-24 DIAGNOSIS — Z79899 Other long term (current) drug therapy: Secondary | ICD-10-CM | POA: Diagnosis not present

## 2020-10-24 LAB — BASIC METABOLIC PANEL
Anion gap: 13 (ref 5–15)
Anion gap: 11 (ref 5–15)
Anion gap: 9 (ref 5–15)
BUN: 9 mg/dL (ref 6–20)
BUN: 10 mg/dL (ref 6–20)
BUN: 7 mg/dL (ref 6–20)
CO2: 21 mmol/L — ABNORMAL LOW (ref 22–32)
CO2: 27 mmol/L (ref 22–32)
CO2: 30 mmol/L (ref 22–32)
Calcium: 8.7 mg/dL — ABNORMAL LOW (ref 8.9–10.3)
Calcium: 8.9 mg/dL (ref 8.9–10.3)
Calcium: 8.4 mg/dL — ABNORMAL LOW (ref 8.9–10.3)
Chloride: 99 mmol/L (ref 98–111)
Chloride: 99 mmol/L (ref 98–111)
Chloride: 97 mmol/L — ABNORMAL LOW (ref 98–111)
Creatinine, Ser: 1.57 mg/dL — ABNORMAL HIGH (ref 0.61–1.24)
Creatinine, Ser: 1.55 mg/dL — ABNORMAL HIGH (ref 0.61–1.24)
Creatinine, Ser: 1.44 mg/dL — ABNORMAL HIGH (ref 0.61–1.24)
GFR, Estimated: 59 mL/min — ABNORMAL LOW (ref >60)
GFR, Estimated: 59 mL/min — ABNORMAL LOW (ref >60)
GFR, Estimated: >60 mL/min (ref >60)
Glucose, Bld: 121 mg/dL — ABNORMAL HIGH (ref 70–99)
Glucose, Bld: 111 mg/dL — ABNORMAL HIGH (ref 70–99)
Glucose, Bld: 101 mg/dL — ABNORMAL HIGH (ref 70–99)
Potassium: 5.1 mmol/L (ref 3.5–5.1)
Potassium: 4.4 mmol/L (ref 3.5–5.1)
Potassium: 3.8 mmol/L (ref 3.5–5.1)
Sodium: 133 mmol/L — ABNORMAL LOW (ref 135–145)
Sodium: 137 mmol/L (ref 135–145)
Sodium: 136 mmol/L (ref 135–145)

## 2020-10-24 LAB — I-STAT ARTERIAL BLOOD GAS, ED
Acid-base deficit: 2 mmol/L (ref 0.0–2.0)
Bicarbonate: 29.4 mmol/L — ABNORMAL HIGH (ref 20.0–28.0)
Calcium, Ion: 1.08 mmol/L — ABNORMAL LOW (ref 1.15–1.40)
HCT: 47 % (ref 39.0–52.0)
Hemoglobin: 16 g/dL (ref 13.0–17.0)
O2 Saturation: 99 %
Patient temperature: 98.6
Potassium: 6.1 mmol/L — ABNORMAL HIGH (ref 3.5–5.1)
Sodium: 136 mmol/L (ref 135–145)
TCO2: 32 mmol/L (ref 22–32)
pCO2 arterial: 80.3 mmHg (ref 32.0–48.0)
pH, Arterial: 7.171 — CL (ref 7.350–7.450)
pO2, Arterial: 208 mmHg — ABNORMAL HIGH (ref 83.0–108.0)

## 2020-10-24 LAB — CBC
HCT: 46.3 % (ref 39.0–52.0)
Hemoglobin: 15.5 g/dL (ref 13.0–17.0)
MCH: 32 pg (ref 26.0–34.0)
MCHC: 33.5 g/dL (ref 30.0–36.0)
MCV: 95.7 fL (ref 80.0–100.0)
Platelets: 244 10*3/uL (ref 150–400)
RBC: 4.84 MIL/uL (ref 4.22–5.81)
RDW: 12.9 % (ref 11.5–15.5)
WBC: 17.5 10*3/uL — ABNORMAL HIGH (ref 4.0–10.5)
nRBC: 0 % (ref 0.0–0.2)

## 2020-10-24 LAB — HIV ANTIBODY (ROUTINE TESTING W REFLEX): HIV Screen 4th Generation wRfx: NONREACTIVE

## 2020-10-24 LAB — ECHOCARDIOGRAM COMPLETE
Area-P 1/2: 4.36 cm2
S' Lateral: 2.6 cm

## 2020-10-24 LAB — ACETAMINOPHEN LEVEL: Acetaminophen (Tylenol), Serum: <10 ug/mL — ABNORMAL LOW (ref 10–30)

## 2020-10-24 LAB — HEPATIC FUNCTION PANEL
ALT: 132 U/L — ABNORMAL HIGH (ref 0–44)
AST: 75 U/L — ABNORMAL HIGH (ref 15–41)
Albumin: 4.1 g/dL (ref 3.5–5.0)
Alkaline Phosphatase: 38 U/L (ref 38–126)
Bilirubin, Direct: 0.1 mg/dL (ref 0.0–0.2)
Indirect Bilirubin: 0.6 mg/dL (ref 0.3–0.9)
Total Bilirubin: 0.7 mg/dL (ref 0.3–1.2)
Total Protein: 7.1 g/dL (ref 6.5–8.1)

## 2020-10-24 LAB — CK: Total CK: 1189 U/L — ABNORMAL HIGH (ref 49–397)

## 2020-10-24 LAB — GLUCOSE, CAPILLARY
Glucose-Capillary: 106 mg/dL — ABNORMAL HIGH (ref 70–99)
Glucose-Capillary: 98 mg/dL (ref 70–99)
Glucose-Capillary: 98 mg/dL (ref 70–99)

## 2020-10-24 LAB — MRSA NEXT GEN BY PCR, NASAL: MRSA by PCR Next Gen: NOT DETECTED

## 2020-10-24 LAB — POTASSIUM
Potassium: 6.8 mmol/L (ref 3.5–5.1)
Potassium: 4.4 mmol/L (ref 3.5–5.1)

## 2020-10-24 LAB — HEMOGLOBIN A1C
Hgb A1c MFr Bld: 5.2 % (ref 4.8–5.6)
Mean Plasma Glucose: 102.54 mg/dL

## 2020-10-24 LAB — AMMONIA: Ammonia: 39 umol/L — ABNORMAL HIGH (ref 9–35)

## 2020-10-24 LAB — TROPONIN I (HIGH SENSITIVITY)
Troponin I (High Sensitivity): 83 ng/L — ABNORMAL HIGH (ref <18)
Troponin I (High Sensitivity): 84 ng/L — ABNORMAL HIGH (ref <18)

## 2020-10-24 LAB — SARS CORONAVIRUS 2 (TAT 6-24 HRS): SARS Coronavirus 2: NEGATIVE

## 2020-10-24 MED ORDER — SODIUM CHLORIDE 0.9 % IV SOLN
12.5000 mg | Freq: Four times a day (QID) | INTRAVENOUS | Status: DC | PRN
  Administered 2020-10-24: 12.5 mg via INTRAVENOUS
  Filled 2020-10-24: qty 0.5

## 2020-10-24 MED ORDER — DEXTROSE 5 % IV SOLN
0.0000 mg/h | INTRAVENOUS | Status: DC
  Administered 2020-10-24: 0.8 mg/h via INTRAVENOUS
  Administered 2020-10-24 – 2020-10-25 (×2): 0.6 mg/h via INTRAVENOUS
  Filled 2020-10-24 (×5): qty 10

## 2020-10-24 MED ORDER — POLYETHYLENE GLYCOL 3350 17 G PO PACK: 17.0000 g | PACK | Freq: Every day | ORAL | Status: DC | PRN

## 2020-10-24 MED ORDER — NALOXONE HCL 4 MG/10ML IJ SOLN
0.5000 mg/h | INTRAVENOUS | Status: DC
  Administered 2020-10-24: 0.5 mg/h via INTRAVENOUS
  Filled 2020-10-24: qty 10

## 2020-10-24 MED ORDER — ESCITALOPRAM OXALATE 10 MG PO TABS: 20.0000 mg | ORAL_TABLET | Freq: Every day | ORAL | Status: DC

## 2020-10-24 MED ORDER — SODIUM BICARBONATE 8.4 % IV SOLN
50.0000 meq | Freq: Once | INTRAVENOUS | Status: AC
  Administered 2020-10-24: 50 meq via INTRAVENOUS
  Filled 2020-10-24: qty 50

## 2020-10-24 MED ORDER — ZIPRASIDONE HCL 80 MG PO CAPS: 80.0000 mg | ORAL_CAPSULE | Freq: Two times a day (BID) | ORAL | Status: DC

## 2020-10-24 MED ORDER — NALOXONE HCL 4 MG/10ML IJ SOLN
0.2000 mg/h | INTRAVENOUS | Status: DC
  Filled 2020-10-24: qty 10

## 2020-10-24 MED ORDER — SODIUM ZIRCONIUM CYCLOSILICATE 10 G PO PACK
10.0000 g | PACK | Freq: Once | ORAL | Status: AC
  Administered 2020-10-24: 10 g via ORAL
  Filled 2020-10-24: qty 1

## 2020-10-24 MED ORDER — NALOXONE HCL 4 MG/10ML IJ SOLN
0.8000 mg/h | INTRAVENOUS | Status: AC
  Administered 2020-10-24: 1 mg/h via INTRAVENOUS
  Filled 2020-10-24: qty 10

## 2020-10-24 MED ORDER — SODIUM ZIRCONIUM CYCLOSILICATE 10 G PO PACK
10.0000 g | PACK | Freq: Three times a day (TID) | ORAL | Status: DC
  Administered 2020-10-24: 10 g
  Filled 2020-10-24: qty 1

## 2020-10-24 MED ORDER — SODIUM CHLORIDE 0.9 % IV SOLN: INTRAVENOUS | Status: DC

## 2020-10-24 MED ORDER — SODIUM CHLORIDE 0.9 % IV BOLUS
500.0000 mL | Freq: Once | INTRAVENOUS | Status: AC
  Administered 2020-10-24: 500 mL via INTRAVENOUS

## 2020-10-24 MED ORDER — DEXTROSE 50 % IV SOLN
1.0000 | Freq: Once | INTRAVENOUS | Status: AC
  Administered 2020-10-24: 50 mL via INTRAVENOUS
  Filled 2020-10-24: qty 50

## 2020-10-24 MED ORDER — LACTATED RINGERS IV SOLN: INTRAVENOUS | Status: DC

## 2020-10-24 MED ORDER — BUSPIRONE HCL 10 MG PO TABS: 30.0000 mg | ORAL_TABLET | Freq: Two times a day (BID) | ORAL | Status: DC

## 2020-10-24 MED ORDER — NALOXONE HCL 4 MG/10ML IJ SOLN
0.4000 mg/h | INTRAVENOUS | Status: DC
  Filled 2020-10-24: qty 10

## 2020-10-24 MED ORDER — DOCUSATE SODIUM 100 MG PO CAPS
100.0000 mg | ORAL_CAPSULE | Freq: Two times a day (BID) | ORAL | Status: DC | PRN
  Administered 2020-10-26: 100 mg via ORAL
  Filled 2020-10-24: qty 1

## 2020-10-24 MED ORDER — LACTATED RINGERS IV BOLUS
1000.0000 mL | Freq: Once | INTRAVENOUS | Status: AC
  Administered 2020-10-24: 1000 mL via INTRAVENOUS

## 2020-10-24 MED ORDER — INSULIN ASPART 100 UNIT/ML IV SOLN
5.0000 [IU] | Freq: Once | INTRAVENOUS | Status: AC
  Administered 2020-10-24: 5 [IU] via INTRAVENOUS

## 2020-10-24 MED ORDER — NALOXONE HCL 4 MG/10ML IJ SOLN
0.6000 mg/h | INTRAVENOUS | Status: AC
  Administered 2020-10-24: 0.6 mg/h via INTRAVENOUS
  Filled 2020-10-24 (×2): qty 10

## 2020-10-24 MED ORDER — FUROSEMIDE 10 MG/ML IJ SOLN
40.0000 mg | Freq: Once | INTRAMUSCULAR | Status: AC
  Administered 2020-10-24: 40 mg via INTRAVENOUS
  Filled 2020-10-24: qty 4

## 2020-10-24 MED ORDER — CHLORHEXIDINE GLUCONATE CLOTH 2 % EX PADS
6.0000 | MEDICATED_PAD | Freq: Every day | CUTANEOUS | Status: DC
  Administered 2020-10-24 – 2020-10-27 (×4): 6 via TOPICAL

## 2020-10-24 MED ORDER — VITAMIN D 25 MCG (1000 UNIT) PO TABS
1000.0000 [IU] | ORAL_TABLET | Freq: Every day | ORAL | Status: DC
  Administered 2020-10-24 – 2020-10-27 (×4): 1000 [IU] via ORAL
  Filled 2020-10-24 (×4): qty 1

## 2020-10-24 MED ORDER — SODIUM CHLORIDE 0.9 % IV SOLN
3.0000 g | Freq: Four times a day (QID) | INTRAVENOUS | Status: AC
  Administered 2020-10-24 – 2020-10-25 (×8): 3 g via INTRAVENOUS
  Filled 2020-10-24 (×8): qty 8

## 2020-10-24 MED ORDER — HEPARIN SODIUM (PORCINE) 5000 UNIT/ML IJ SOLN
5000.0000 [IU] | Freq: Three times a day (TID) | INTRAMUSCULAR | Status: DC
  Administered 2020-10-24 – 2020-10-27 (×10): 5000 [IU] via SUBCUTANEOUS
  Filled 2020-10-24 (×10): qty 1

## 2020-10-24 MED ORDER — NALOXONE HCL 0.4 MG/ML IJ SOLN
0.4000 mg | INTRAMUSCULAR | Status: DC | PRN
  Administered 2020-10-24: 0.4 mg via INTRAVENOUS
  Administered 2020-10-24: 0.8 mg via INTRAVENOUS
  Administered 2020-10-24 (×2): 0.4 mg via INTRAVENOUS
  Filled 2020-10-24 (×5): qty 1

## 2020-10-24 NOTE — Progress Notes (Addendum)
  Admitted to the ICU this am on narcan gtt.  Narcan gtt was at 1 mg/hr just reduced to 0.$RemoveBefo'8mg'feLztIxBIlT$ / hr- slightly more sleepy.  Additionally AKI with hyperkalemia.  S/p lokelmia.  Patient states he doesn't know what he took- thought it was methamphetamines but obviously wasn't.  Denies any SI intentions.  Asking repeatedly for something to eat/ drink but explained to patient why he cant have anything more than ice chips at this time and he voiced understanding.   Blood pressure 122/80, pulse 87, temperature 98.2 F (36.8 C), temperature source Oral, resp. Rate 16, SpO2 90 %.-> placed on O2 2L 93%  General:  Adult male lying in bed in NAD HEENT: MM pink/moist, pupils 3/reactive Neuro: sleepy but Aox3, MAE CV: NSR, no murmur PULM:  non labored, clear- diminished in R base GI: soft, bs+, NT Extremities: warm/dry, no edema  Skin: no rashes, some bruising to extremities- remains in handcuffs x 4 Officers remain at bedside   P:  Wean narcan gtt to off as tolerated Aspiration precautions NPO, ice chips only  Cont. Unasyn for now Continue supplemental O2 prn for sat Repeat BMET now and check CK  Continue to monitor closely    CCT 30 mins    Kennieth Rad, ACNP [facility] 10/24/2020, 9:28 AM  See Amion for pager If no response to pager, please call PCCM consult pager After 7:00 pm call [place] care attending attestation note:  Patient seen and examined and relevant ancillary tests reviewed.  I agree with the assessment and plan of care as outlined by Jennelle Human, NP.   35 year old admitted from prison after arrest and 50 minutes of CPR (unclear if actually lost pulse) with response to Narcan.  Needs Narcan drip to maintain consciousness.  Attempt to wean Narcan drip during the morning.  A bit more sleepy but arousable on 0.6/h.  Increase back to 0.8 /h.  Was on 1.0/h this morning when we met.  Synopsis of assessment and plan:  Toxic metabolic  encephalopathy: In the setting of overdose.  Not intentional.  Concern for long-acting opiates given need for ongoing Narcan drip. --Continue Narcan drip, attempt to down titrate again, every 2 hours -- Remains n.p.o.  CRITICAL CARE Performed by: Bonna Gains Zedric Deroy   Total critical care time: 20 minutes  Critical care time was exclusive of separately billable procedures and treating other patients.  Critical care was necessary to treat or prevent imminent or life-threatening deterioration.  Critical care was time spent personally by me on the following activities: development of treatment plan with patient and/or surrogate as well as nursing, discussions with consultants, evaluation of patient's response to treatment, examination of patient, obtaining history from patient or surrogate, ordering and performing treatments and interventions, ordering and review of laboratory studies, ordering and review of radiographic studies, pulse oximetry, re-evaluation of patient's condition and participation in multidisciplinary rounds.  Lanier Clam, MD  Pulmonary/ICU Physician [facility]  See Amion for contact info  10/24/2020, 12:37 PM

## 2020-10-24 NOTE — Progress Notes (Signed)
ABG reviewed.  Thankfully narcan dose has worked (ABG was before narcan).  Pt now AAOx4 again, talking.  Now admits that he did indeed take something that he thought was "meth".  But admits it clearly isnt meth, and hes not sure what it is.  Clearly is a long acting opiate.  Leaving him on PRN narcan as needed for now.

## 2020-10-24 NOTE — ED Notes (Signed)
Pt continuing to vomit after receiving dose of Zofran (see MAR). Dr. Julian Reil made aware.

## 2020-10-24 NOTE — H&P (Signed)
NAME:  TYHIR SCHWAN, MRN:  [id number], DOB:  [birth year], LOS: 0 ADMISSION DATE:  10/23/2020, CONSULTATION DATE: 10/24/20 REFERRING MD:  Julian Reil, CHIEF COMPLAINT:  altered, cardiac arrest   History of Present Illness:  Mr. Stewart is a 35 year old man with a hx of Bipolar disorder, polysubstance abute who is incarcerated, found by officers unresponsive, apneic, CPR x 15 min, ROSC.  He received 4mg  of narcan.  Alert and oriented x 4 when arrived in ED.  Initially hypoxemic 87% on RA.    Some atelectasis vs consolidation at R base.  No dyspnea, no chest pain.    Tells me he sniffed a blue powder substance at about 5pm.  Not sure what it was, he believed it to be something like a methamphetamine.  He says he immediately "went out" when he took it.  He has taken fentanyl in the past and he says he did not react this way. Denies ever having had an overdose in the past.   Pertinent  Medical History  Arthritis, bipolar disorder, Chronic back pain Hx polysubstance abuse (opiates, cannabis, tobacco)  Meds: buspar, vit D , lexapro, goedon, diclofenac, hydroxyzine prn, ibuprofen, zyprexa prn, tramadol prn , trazodone 50qhs,   Significant Hospital Events: Including procedures, antibiotic start and stop dates in addition to other pertinent events     Interim History / Subjective:    Objective   Blood pressure 132/86, pulse 100, temperature 98.8 F (37.1 C), temperature source Oral, resp. rate 14, SpO2 97 %.       No intake or output data in the 24 hours ending 10/24/20 0446 There were no vitals filed for this visit.  Examination: General: NAD, somewhat sleepy but easily arousable.   HENT: NCAT, pupils small and reactive  Lungs: CTAB Cardiovascular: RRR no mgr  Abdomen: nt, mildly distended (normal per pt), nbs Extremities: no edema no tenderness no redness Neuro: alert and oriented x 3  CT chest   IMPRESSION: 1. No evidence of significant pulmonary embolus. 2. Atelectasis or  infiltration in the right lung base.  CT head neg Resolved Hospital Problem list     Assessment & Plan:  Opiate overdose, ams:  Responding well to narcan.  Continue infusion.  Monitor RR.   R LL opacity: empirically treating with unasyn .  AKI Cr 1.57 Alst/alt slightly elevated  WBC 17.5 Best Practice (right click and "Reselect all SmartList Selections" daily)   Diet/type: Regular consistency (see orders) DVT prophylaxis: LMWH GI prophylaxis: N/A Lines: N/A Foley:  N/A Code Status:  full code Last date of multidisciplinary goals of care discussion ]  Labs   CBC: Recent Labs  Lab 10/23/20 1919 10/23/20 1935 10/24/20 0100 10/24/20 0333  WBC 12.9*  --  17.5*  --   NEUTROABS 11.1*  --   --   --   HGB 15.3 15.3 15.5 16.0  HCT 45.2 45.0 46.3 47.0  MCV 93.8  --  95.7  --   PLT 220  --  244  --     Basic Metabolic Panel: Recent Labs  Lab 10/23/20 1919 10/23/20 1935 10/24/20 0100 10/24/20 0333  NA 137 139 133* 136  K 4.0 4.0 5.1 6.1*  CL 102 99 99  --   CO2 24  --  21*  --   GLUCOSE 194* 194* 121*  --   BUN 9 9 9   --   CREATININE 1.69* 1.50* 1.57*  --   CALCIUM 8.8*  --  8.7*  --  GFR: CrCl cannot be calculated (Unknown ideal weight.). Recent Labs  Lab 10/23/20 1919 10/23/20 2127 10/24/20 0100  WBC 12.9*  --  17.5*  LATICACIDVEN 2.9* 1.3  --     Liver Function Tests: Recent Labs  Lab 10/23/20 1919 10/24/20 0100  AST 65* 75*  ALT 120* 132*  ALKPHOS 37* 38  BILITOT 0.8 0.7  PROT 6.7 7.1  ALBUMIN 4.0 4.1   No results for input(s): LIPASE, AMYLASE in the last 168 hours. Recent Labs  Lab 10/24/20 0230  AMMONIA 39*    ABG    Component Value Date/Time   PHART 7.171 (LL) 10/24/2020 0333   PCO2ART 80.3 (HH) 10/24/2020 0333   PO2ART 208 (H) 10/24/2020 0333   HCO3 29.4 (H) 10/24/2020 0333   TCO2 32 10/24/2020 0333   ACIDBASEDEF 2.0 10/24/2020 0333   O2SAT 99.0 10/24/2020 0333     Coagulation Profile: No results for input(s): INR,  PROTIME in the last 168 hours.  Cardiac Enzymes: No results for input(s): CKTOTAL, CKMB, CKMBINDEX, TROPONINI in the last 168 hours.  HbA1C: Hgb A1c MFr Bld  Date/Time Value Ref Range Status  10/24/2020 01:00 AM 5.2 4.8 - 5.6 % Final    Comment:    (NOTE) Pre diabetes:          5.7%-6.4%  Diabetes:              >6.4%  Glycemic control for   <7.0% adults with diabetes   08/17/2014 06:39 AM 5.8 (H) 4.8 - 5.6 % Final    Comment:    (NOTE)         Pre-diabetes: 5.7 - 6.4         Diabetes: >6.4         Glycemic control for adults with diabetes: <7.0     CBG: No results for input(s): GLUCAP in the last 168 hours.  Review of Systems:   Review of Systems  Constitutional:  Negative for fever.  HENT:  Negative for hearing loss.   Eyes:  Negative for blurred vision.  Respiratory:  Negative for cough.   Cardiovascular:  Negative for chest pain and palpitations.  Gastrointestinal:  Negative for heartburn.  Genitourinary:  Negative for dysuria.  Musculoskeletal:  Negative for myalgias.  Skin:  Negative for rash.  Neurological:  Negative for dizziness.  Endo/Heme/Allergies:  Does not bruise/bleed easily.  Psychiatric/Behavioral:  Negative for depression.     Past Medical History:  He,  has a past medical history of Arthritis, Bipolar disorder (HCC), Chronic back pain, and Mental disorder.   Surgical History:   Past Surgical History:  Procedure Laterality Date   EYE SURGERY Right    metal plate    FRACTURE SURGERY     right elbow     Social History:   reports that he has been smoking cigarettes. He has been smoking an average of 1 pack per day. He uses smokeless tobacco. He reports current drug use. Drug: Oxycodone. He reports that he does not drink alcohol.   Family History:  His family history includes Heart attack in his paternal grandfather.   Allergies No Known Allergies   Home Medications  Prior to Admission medications   Medication Sig Start Date End Date  Taking? Authorizing Provider  busPIRone (BUSPAR) 30 MG tablet Take 30 mg by mouth 2 (two) times daily.   Yes [provider]  cholecalciferol (VITAMIN D) 25 MCG (1000 UNIT) tablet Take 1,000 Units by mouth daily.   Yes [provider]  escitalopram (LEXAPRO) 20 MG tablet Take 20 mg by mouth at bedtime.   Yes [provider]  ziprasidone (GEODON) 40 MG capsule Take 80 mg by mouth 2 (two) times daily with a meal.   Yes [provider]     Critical care time: 45 min

## 2020-10-24 NOTE — Progress Notes (Signed)
Pharmacy Antibiotic Note  James [facility] is a 35 y.o. male admitted on 10/23/2020 after a cardiac arrest while incarcerated >> concern for aspiration pneumonia.  Pharmacy has been consulted for Unasyn dosing.  Plan: Unasyn 3g IV Q6H.   Temp (24hrs), Avg:98.8 F (37.1 C), Min:98.8 F (37.1 C), Max:98.8 F (37.1 C)  Recent Labs  Lab 10/23/20 1919 10/23/20 1935 10/23/20 2127  WBC 12.9*  --   --   CREATININE 1.69* 1.50*  --   LATICACIDVEN 2.9*  --  1.3     No Known Allergies   Thank you for allowing pharmacy to be a part of this patient's care.  Vernard Gambles, PharmD, BCPS  10/24/2020 12:20 AM

## 2020-10-24 NOTE — Progress Notes (Signed)
Discussed with Dr. Judeth Horn, patient picked up by PCCM.  Hospitalist signing off.

## 2020-10-24 NOTE — Progress Notes (Addendum)
Pt arrived to unit escorted by two police officers. Tobacco products and a piece of a battery was discovered wrapped in a glove once pt. was transferred out of transferring units bed. The items were confiscated by the accompanying officers. Pt currently on Narcan gtt and 4L nasal canula. Vital signs are stable will continue to monitor.

## 2020-10-24 NOTE — Progress Notes (Signed)
About 30 mins after narcan dose, pt becoming more sleepy, RR back down to 8.  Got another narcan dose now, with improvement in mental status and RR up to 18.  1) will order narcan gtt 2) will hold all home psych meds since clearly has long acting opiate on board 3) will consult PCCM since he will need ICU bed with narcan gtt

## 2020-10-24 NOTE — Progress Notes (Addendum)
eLink Physician-Brief Progress Note Patient Name: James Walsh DOB: 1985/07/19 MRN: [id number]   Date of Service  10/24/2020  HPI/Events of Note  Brief HPI: 35 yr old male incarcerated in Prison, Bipolar disorder, poly substance abuse went into cardiac pea arrest, ROSC post 15 nibs ACLS. In ED received narcan couple times for getting into low GCS. On Narcan gtt- to ICU admit.   Notes, labs reviewed. UDS + for opiates. ? Meth/blue stuff. Trops: 22 and 62.  Lactate 2.9 and 1.3.  WBC 12.9k  glucose; 194, no h/o DM  Cr: 1.69 up from 1.22 in 2020.  CTA neg for PE.  Does have RLL infiltrate vs atelectasis.  7.17/80/208, 29 K 6.1, tylenol < 10. AG normal Covid pending  Camera: He is talking to police. On nasal o2. VS stable.  A/P: S/P Cardiac arrest w/o hypoxemic events to brain, opates/drug induced encephalopathy. Aspiration PNA/AHRF.no PE.  AKI Hyperglycemia BPD  eICU Interventions  Aspiration precautions while on narcan gtt. S/p 2 lit fluids.  - on Unasyn abx for PNA - get ECHO - on SSI - on sq heparin as VTE prophylaxis - sz precautions.      Intervention Category Major Interventions: Change in mental status - evaluation and management Evaluation Type: New Patient Evaluation  [name] 10/24/2020, 4:55 AM  5:33 AM K 6.1 from ABG reports. Was 5.1 on earlier BMP. Discussed with RN. - re check potassium level stat, if high call back for treatment.   6:25 K going up to 6.8 Hyperkalemia Rx ordered.

## 2020-10-24 NOTE — ED Notes (Signed)
Provider made aware of trop. 

## 2020-10-24 NOTE — Progress Notes (Addendum)
Pt with increasing lethargy and O2 requirement: 1) checking ABG 2) DCing IVF since BP running on high side 3) repeat CXR 4) check tylenol level 5) RR only 6-8, trying a re-dose of narcan (? Longer acting opiate in system?)  Due to persistent N/V, not candidate for BIPAP.

## 2020-10-24 NOTE — Progress Notes (Signed)
  Echocardiogram 2D Echocardiogram has been performed.  Wallace Gappa S Lylah Lantis 10/24/2020, 11:21 AM 

## 2020-10-25 LAB — COMPREHENSIVE METABOLIC PANEL
ALT: 89 U/L — ABNORMAL HIGH (ref 0–44)
AST: 71 U/L — ABNORMAL HIGH (ref 15–41)
Albumin: 3.3 g/dL — ABNORMAL LOW (ref 3.5–5.0)
Alkaline Phosphatase: 25 U/L — ABNORMAL LOW (ref 38–126)
Anion gap: 8 (ref 5–15)
BUN: 6 mg/dL (ref 6–20)
CO2: 27 mmol/L (ref 22–32)
Calcium: 8.4 mg/dL — ABNORMAL LOW (ref 8.9–10.3)
Chloride: 101 mmol/L (ref 98–111)
Creatinine, Ser: 1.13 mg/dL (ref 0.61–1.24)
GFR, Estimated: >60 mL/min (ref >60)
Glucose, Bld: 84 mg/dL (ref 70–99)
Potassium: 3.7 mmol/L (ref 3.5–5.1)
Sodium: 136 mmol/L (ref 135–145)
Total Bilirubin: 0.8 mg/dL (ref 0.3–1.2)
Total Protein: 5.6 g/dL — ABNORMAL LOW (ref 6.5–8.1)

## 2020-10-25 LAB — GLUCOSE, CAPILLARY
Glucose-Capillary: 88 mg/dL (ref 70–99)
Glucose-Capillary: 103 mg/dL — ABNORMAL HIGH (ref 70–99)
Glucose-Capillary: 98 mg/dL (ref 70–99)
Glucose-Capillary: 95 mg/dL (ref 70–99)

## 2020-10-25 LAB — CBC
HCT: 38 % — ABNORMAL LOW (ref 39.0–52.0)
Hemoglobin: 12.9 g/dL — ABNORMAL LOW (ref 13.0–17.0)
MCH: 32 pg (ref 26.0–34.0)
MCHC: 33.9 g/dL (ref 30.0–36.0)
MCV: 94.3 fL (ref 80.0–100.0)
Platelets: 160 10*3/uL (ref 150–400)
RBC: 4.03 MIL/uL — ABNORMAL LOW (ref 4.22–5.81)
RDW: 12.7 % (ref 11.5–15.5)
WBC: 8.9 10*3/uL (ref 4.0–10.5)
nRBC: 0 % (ref 0.0–0.2)

## 2020-10-25 LAB — CK: Total CK: 1534 U/L — ABNORMAL HIGH (ref 49–397)

## 2020-10-25 LAB — MAGNESIUM: Magnesium: 1.7 mg/dL (ref 1.7–2.4)

## 2020-10-25 MED ORDER — POTASSIUM CHLORIDE CRYS ER 20 MEQ PO TBCR
40.0000 meq | EXTENDED_RELEASE_TABLET | Freq: Once | ORAL | Status: AC
  Administered 2020-10-25: 40 meq via ORAL
  Filled 2020-10-25: qty 2

## 2020-10-25 MED ORDER — AMOXICILLIN-POT CLAVULANATE 875-125 MG PO TABS
1.0000 | ORAL_TABLET | Freq: Two times a day (BID) | ORAL | Status: DC
  Administered 2020-10-26 – 2020-10-27 (×3): 1 via ORAL
  Filled 2020-10-25 (×3): qty 1

## 2020-10-25 NOTE — Plan of Care (Signed)

## 2020-10-25 NOTE — Progress Notes (Signed)
NAME:  James Walsh, MRN:  [id number], DOB:  1985/05/03, LOS: 1 ADMISSION DATE:  10/23/2020, CONSULTATION DATE: 10/24/20 REFERRING MD:  Julian Reil, CHIEF COMPLAINT:  altered, cardiac arrest   History of Present Illness:  James Walsh is a 35 year old man with a hx of Bipolar disorder, polysubstance abute who is incarcerated, found by officers unresponsive, apneic, CPR x 15 min, ROSC.  He received 4mg  of narcan.  Alert and oriented x 4 when arrived in ED.  Initially hypoxemic 87% on RA.    Some atelectasis vs consolidation at R base.  No dyspnea, no chest pain.    Reports he sniffed a blue powder substance at about 5pm on 9/18.  Not sure what it was, he believed it to be something like a methamphetamine.  He says he immediately "went out" when he took it.  He has taken fentanyl in the past and he says he did not react this way. Denies ever having had an overdose in the past.   Pertinent  Medical History  Arthritis, bipolar disorder, Chronic back pain Hx polysubstance abuse (opiates, cannabis, tobacco), Hep C  Meds: buspar, vit D , lexapro, goedon, diclofenac, hydroxyzine prn, ibuprofen, zyprexa prn, tramadol prn , trazodone 50qhs,   Significant Hospital Events: Including procedures, antibiotic start and stop dates in addition to other pertinent events   9/18 admitted to ICU on narcan gtt.  Started Unasyn for aspiration.   Interim History / Subjective:  Reports ongoing headache.  Slept poorly overnight.  Wants to eat.  Narcan gtt at 0.6 overnight, not titrated Good UOP 2.6L /24/hr sCr continues to improve  Objective   Blood pressure 119/87, pulse 74, temperature 98.2 F (36.8 C), temperature source Oral, resp. rate 11, weight 79.5 kg, SpO2 (!) 88 %.        Intake/Output Summary (Last 24 hours) at 10/25/2020 0841 Last data filed at 10/25/2020 0800 Gross per 24 hour  Intake 1820.46 ml  Output 2975 ml  Net -1154.54 ml   Filed Weights   10/25/20 0500  Weight: 79.5 kg    Examination: General:  Adult male sitting upright in bed watching TV in NAD HEENT: MM pink/moist Neuro: Aox 3, MAE CV: rr, NSR, no murmur PULM:  non labored, CTA, on room air at 93% GI: soft, bs +, voids  Extremities: warm/dry, no LE edema  Skin: bruising to wrist Deputies remain at bedside, remains cuffed to bed   9/18 CT chest  >> neg for PE, atelectasis vs infiltrate in R base 9/18 CTH >> neg 9/19 TTE >> normal, EF 60-65%  Labs reviewed:  K 3.7, sCr 1.44-> 1.13, improving LFTs, CK 1189-> 1534   Resolved Hospital Problem list     Assessment & Plan:   Toxic / metabolic encephalopathy in the setting of unintentional overdose on unknown substance responsive to narcan  Polysubstance Abuse  - continue to wean narcan gtt as mental status allows - serial neuro exams  - ongoing education.  Would likely benefit from outpt counseling  - holding pta buspar, lexapro, and geodon   Probable aspiration pneumonia, RLL  Hypoxic respiratory failure - multifactorial 2/2 probable aspiration and encephalopathy  - supplemental O2 prn  - continue NPO for now while on narcan gtt given intubation risk  - aspiration precautions  - ongoing pulm hygiene  - continue unasyn for 5 days    AKI - prerenal  Hypokalemia  Elevated CK - UOP great.  sCr continues to improve despite bump in CK - KCL  40 meq x 1.  Check mag level - continue LR at 75 ml/hr while NPO - trend renal indices - strict I/Os   Transaminitis  Hx of hep C per patient  - LFTs stable  - patient states he plans to get hep C treated when he gets out of jail    Best Practice (right click and "Reselect all SmartList Selections" daily)   Diet/type: NPO w/ oral meds DVT prophylaxis: prophylactic heparin  GI prophylaxis: N/A Lines: N/A Foley:  N/A Code Status:  full code Last date of multidisciplinary goals of care discussion.  Patient updated on plan of care.   Labs   CBC: Recent Labs  Lab 10/23/20 1919  10/23/20 1935 10/24/20 0100 10/24/20 0333 10/25/20 0325  WBC 12.9*  --  17.5*  --  8.9  NEUTROABS 11.1*  --   --   --   --   HGB 15.3 15.3 15.5 16.0 12.9*  HCT 45.2 45.0 46.3 47.0 38.0*  MCV 93.8  --  95.7  --  94.3  PLT 220  --  244  --  160     Basic Metabolic Panel: Recent Labs  Lab 10/23/20 1919 10/23/20 1935 10/24/20 0100 10/24/20 0333 10/24/20 0536 10/24/20 0739 10/24/20 1156 10/25/20 0325  NA 137 139 133* 136  --  137 136 136  K 4.0 4.0 5.1 6.1* 6.8* 4.4  4.4 3.8 3.7  CL 102 99 99  --   --  99 97* 101  CO2 24  --  21*  --   --  27 30 27   GLUCOSE 194* 194* 121*  --   --  111* 101* 84  BUN 9 9 9   --   --  10 7 6   CREATININE 1.69* 1.50* 1.57*  --   --  1.55* 1.44* 1.13  CALCIUM 8.8*  --  8.7*  --   --  8.9 8.4* 8.4*    GFR: CrCl cannot be calculated (Unknown ideal weight.). Recent Labs  Lab 10/23/20 1919 10/23/20 2127 10/24/20 0100 10/25/20 0325  WBC 12.9*  --  17.5* 8.9  LATICACIDVEN 2.9* 1.3  --   --      Liver Function Tests: Recent Labs  Lab 10/23/20 1919 10/24/20 0100 10/25/20 0325  AST 65* 75* 71*  ALT 120* 132* 89*  ALKPHOS 37* 38 25*  BILITOT 0.8 0.7 0.8  PROT 6.7 7.1 5.6*  ALBUMIN 4.0 4.1 3.3*    No results for input(s): LIPASE, AMYLASE in the last 168 hours. Recent Labs  Lab 10/24/20 0230  AMMONIA 39*     ABG    Component Value Date/Time   PHART 7.171 (LL) 10/24/2020 0333   PCO2ART 80.3 (HH) 10/24/2020 0333   PO2ART 208 (H) 10/24/2020 0333   HCO3 29.4 (H) 10/24/2020 0333   TCO2 32 10/24/2020 0333   ACIDBASEDEF 2.0 10/24/2020 0333   O2SAT 99.0 10/24/2020 0333      Coagulation Profile: No results for input(s): INR, PROTIME in the last 168 hours.  Cardiac Enzymes: Recent Labs  Lab 10/24/20 0739 10/25/20 0325  CKTOTAL 1,189* 1,534*    HbA1C: Hgb A1c MFr Bld  Date/Time Value Ref Range Status  10/24/2020 01:00 AM 5.2 4.8 - 5.6 % Final    Comment:    (NOTE) Pre diabetes:          5.7%-6.4%  Diabetes:               >6.4%  Glycemic control for   <7.0% adults with  diabetes   08/17/2014 06:39 AM 5.8 (H) 4.8 - 5.6 % Final    Comment:    (NOTE)         Pre-diabetes: 5.7 - 6.4         Diabetes: >6.4         Glycemic control for adults with diabetes: <7.0     CBG: Recent Labs  Lab 10/24/20 0645 10/24/20 1139 10/24/20 1512 10/25/20 0734  GLUCAP 106* 98 98 88     Critical care time: 30 min       Posey Boyer, ACNP [facility] 10/25/2020, 8:41 AM  See Amion for pager If no response to pager, please call PCCM consult pager After 7:00 pm call Elink

## 2020-10-26 LAB — BASIC METABOLIC PANEL
Anion gap: 7 (ref 5–15)
BUN: 5 mg/dL — ABNORMAL LOW (ref 6–20)
CO2: 27 mmol/L (ref 22–32)
Calcium: 8.5 mg/dL — ABNORMAL LOW (ref 8.9–10.3)
Chloride: 101 mmol/L (ref 98–111)
Creatinine, Ser: 1.2 mg/dL (ref 0.61–1.24)
GFR, Estimated: >60 mL/min (ref >60)
Glucose, Bld: 93 mg/dL (ref 70–99)
Potassium: 4.2 mmol/L (ref 3.5–5.1)
Sodium: 135 mmol/L (ref 135–145)

## 2020-10-26 LAB — GLUCOSE, CAPILLARY
Glucose-Capillary: 90 mg/dL (ref 70–99)
Glucose-Capillary: 90 mg/dL (ref 70–99)
Glucose-Capillary: 97 mg/dL (ref 70–99)
Glucose-Capillary: 90 mg/dL (ref 70–99)

## 2020-10-26 MED ORDER — DIPHENHYDRAMINE HCL 50 MG/ML IJ SOLN
25.0000 mg | Freq: Four times a day (QID) | INTRAMUSCULAR | Status: DC | PRN
  Administered 2020-10-26: 25 mg via INTRAVENOUS
  Filled 2020-10-26: qty 1

## 2020-10-26 MED ORDER — HYDROXYZINE HCL 10 MG PO TABS
10.0000 mg | ORAL_TABLET | Freq: Three times a day (TID) | ORAL | Status: DC | PRN
  Administered 2020-10-26 – 2020-10-27 (×3): 10 mg via ORAL
  Filled 2020-10-26 (×4): qty 1

## 2020-10-26 NOTE — Progress Notes (Signed)
NAME:  James Walsh, MRN:  [id number], DOB:  [birth year], LOS: 2 ADMISSION DATE:  10/23/2020, CONSULTATION DATE: 10/24/20 REFERRING MD:  Julian Reil, CHIEF COMPLAINT:  altered, cardiac arrest   Brief HPI:  35 year old male with a hx of Bipolar disorder, tobacco abuse, polysubstance abuse and hepatitis C, currently incarcerated, who was found unresponsive and apneic in his cell.  CPR was performed by 15 mins with ROSC.  He received 4mg  of narcan at some point during resuscitation.    Arrived to [facility] ER alert and oriented but hypoxic on room air at 87%.  Patient reported taking a blue powder substance around 5pm in which he thought was methamphetamines and immediately went out.  Reports taking fentanyl in the past but never reacted like that; appears to have been on suboxone from [place] in the past.  No prior overdoses in the past. Patient has denied any suicidal thoughts.  Workup notable for UDS positive for opiates.  Troponin hs 22 and 62, lactic 2.9 corrected to 1.3 after fluids, WBC 12.9, and sCr 1.69 up from 1.22 in 2020.  Negative ETOH, tylenol, and salicylate level.  CXR noted some atelectasis vs consolidation at right base.  CTA was negative for pulmonary embolism.  Has denied any dyspnea or chest pain.  Remained persistently hypoxic improved with nasal cannula.  He received 2 liters of fluid and started on unasyn for suspected aspiration.  He had some vomiting episodes despite zofran in ER and became progressively lethargic but responded and needed several repeat doses of narcan to maintain mental status.  Strongly suspected that whatever patient did take, was clearly a longer acting opiate.  He was placed on narcan gtt and critical care consulted for admit to ICU.  Pertinent  Medical History  Arthritis, bipolar disorder, Chronic back pain Hx polysubstance abuse (opiates, cannabis, tobacco), Hep C  Meds: buspar, vit D , lexapro, goedon, diclofenac, hydroxyzine prn, ibuprofen, zyprexa prn,  tramadol prn , trazodone 50qhs,   Significant Hospital Events: Including procedures, antibiotic start and stop dates in addition to other pertinent events   9/18 admitted to ICU on narcan gtt.  Started Unasyn for aspiration.  9/19 titrating narcan gtt, remains on prn O2, renal function continues to improve 9/20 narcan gtt off   Interim History / Subjective:  Patient with episodes of itching overnight and ongoing.  Not much relief from benadryl overnight.  States this is common for him when he does heroin.  No other new meds (not changed to augment yet) to explain pruritis   Ambulatory around unit this morning with O2 saturations ranging from 86 to 96% on room air.  Objective   Blood pressure 126/79, pulse 93, temperature 98.3 F (36.8 C), temperature source Oral, resp. rate 10, weight 79.5 kg, SpO2 (!) 89 %.        Intake/Output Summary (Last 24 hours) at 10/26/2020 0932 Last data filed at 10/26/2020 0600 Gross per 24 hour  Intake 679.27 ml  Output 775 ml  Net -95.73 ml   Filed Weights   10/25/20 0500 10/26/20 0356  Weight: 79.5 kg 79.5 kg   Examination: General:  Adult male sitting in bedside recliner in NAD HEENT: MM pink/moist, some erythema around eyes, no edema  Neuro: Alert, oriented, MAE CV: rr, NSR, no murmur PULM:  non labored, clear, slightly diminished in R base GI: soft, bs active, NT, voids Extremities: warm/dry, no LE edema  Skin: arms, chest, face with some erythema from pt scratching.  No hives  Sheriffs deputies remain at bedside.  Remains cuffed with some bruising to sites.    9/18 CT chest  >> neg for PE, atelectasis vs infiltrate in R base 9/18 CTH >> neg 9/19 TTE >> normal, EF 60-65%  Labs reviewed 9/21, BMET stable   Resolved Hospital Problem list     Assessment & Plan:   Toxic / metabolic encephalopathy in the setting of unintentional overdose/ cardiac arrest on unknown substance responsive to narcan  Polysubstance Abuse  P:  - off narcan  gtt as of 9/20.  No obvious signs of withdrawal other than pruritis.  PRN hydroxyzine ordered - continue to trend neuro exams.  Appears to not have any residual neuro deficits from cardiac arrest  - Would likely benefit from outpt substance abuse counseling  - holding pta buspar, lexapro, and geodon-> does not appear to have been taking these while incarcerated    Probable aspiration pneumonia, RLL  Hypoxic respiratory failure - multifactorial 2/2 probable aspiration and encephalopathy  - admit CTA neg for PE P:  - still needing prn O2 and dropped to 86% on room air during ambulation today, therefore not ready for discharge.  - ongoing pulm hygiene/ IS/ continue to mobilize  - changed to augmentin 9/21, stop date in for 9/24   AKI - prerenal  Hypokalemia  Elevated CK P:  - Trend BMP / urinary output - Replace electrolytes as indicated - Avoid nephrotoxic agents, ensure adequate renal perfusion   Transaminitis  Hx of hep C per patient  P: - LFTs stable  - patient states he plans to treatment for hep C treated when he gets out of jail    Best Practice (right click and "Reselect all SmartList Selections" daily)   Diet/type: Regular consistency (see orders) DVT prophylaxis: prophylactic heparin  GI prophylaxis: N/A Lines: N/A Foley:  N/A Code Status:  full code Last date of multidisciplinary goals of care discussion.  Patient updated on plan of care.   Pending transfer to tele bed.  Given patients ongoing O2 needs, he is not ready for discharge at this time.  To [facility] as of 9/22, PCCM available as needed.   Labs   CBC: Recent Labs  Lab 10/23/20 1919 10/23/20 1935 10/24/20 0100 10/24/20 0333 10/25/20 0325  WBC 12.9*  --  17.5*  --  8.9  NEUTROABS 11.1*  --   --   --   --   HGB 15.3 15.3 15.5 16.0 12.9*  HCT 45.2 45.0 46.3 47.0 38.0*  MCV 93.8  --  95.7  --  94.3  PLT 220  --  244  --  160    Basic Metabolic Panel: Recent Labs  Lab 10/24/20 0100 10/24/20 0333  10/24/20 0536 10/24/20 0739 10/24/20 1156 10/25/20 0325 10/26/20 0120  NA 133* 136  --  137 136 136 135  K 5.1 6.1* 6.8* 4.4  4.4 3.8 3.7 4.2  CL 99  --   --  99 97* 101 101  CO2 21*  --   --  27 30 27 27   GLUCOSE 121*  --   --  111* 101* 84 93  BUN 9  --   --  10 7 6  5*  CREATININE 1.57*  --   --  1.55* 1.44* 1.13 1.20  CALCIUM 8.7*  --   --  8.9 8.4* 8.4* 8.5*  MG  --   --   --   --   --  1.7  --    GFR: CrCl  cannot be calculated (Unknown ideal weight.). Recent Labs  Lab 10/23/20 1919 10/23/20 2127 10/24/20 0100 10/25/20 0325  WBC 12.9*  --  17.5* 8.9  LATICACIDVEN 2.9* 1.3  --   --     Liver Function Tests: Recent Labs  Lab 10/23/20 1919 10/24/20 0100 10/25/20 0325  AST 65* 75* 71*  ALT 120* 132* 89*  ALKPHOS 37* 38 25*  BILITOT 0.8 0.7 0.8  PROT 6.7 7.1 5.6*  ALBUMIN 4.0 4.1 3.3*   No results for input(s): LIPASE, AMYLASE in the last 168 hours. Recent Labs  Lab 10/24/20 0230  AMMONIA 39*    ABG    Component Value Date/Time   PHART 7.171 (LL) 10/24/2020 0333   PCO2ART 80.3 (HH) 10/24/2020 0333   PO2ART 208 (H) 10/24/2020 0333   HCO3 29.4 (H) 10/24/2020 0333   TCO2 32 10/24/2020 0333   ACIDBASEDEF 2.0 10/24/2020 0333   O2SAT 99.0 10/24/2020 0333     Coagulation Profile: No results for input(s): INR, PROTIME in the last 168 hours.  Cardiac Enzymes: Recent Labs  Lab 10/24/20 0739 10/25/20 0325  CKTOTAL 1,189* 1,534*    HbA1C: Hgb A1c MFr Bld  Date/Time Value Ref Range Status  10/24/2020 01:00 AM 5.2 4.8 - 5.6 % Final    Comment:    (NOTE) Pre diabetes:          5.7%-6.4%  Diabetes:              >6.4%  Glycemic control for   <7.0% adults with diabetes   08/17/2014 06:39 AM 5.8 (H) 4.8 - 5.6 % Final    Comment:    (NOTE)         Pre-diabetes: 5.7 - 6.4         Diabetes: >6.4         Glycemic control for adults with diabetes: <7.0     CBG: Recent Labs  Lab 10/25/20 0734 10/25/20 1130 10/25/20 1511 10/25/20 2213  10/26/20 0711  GLUCAP 88 103* 98 95 90     Critical care time: n/a       Posey Boyer, ACNP [facility] 10/26/2020, 9:32 AM  See Loretha Stapler for pager If no response to pager, please call PCCM consult pager After 7:00 pm call Elink

## 2020-10-26 NOTE — Progress Notes (Signed)
eLink Physician-Brief Progress Note Patient Name: James Walsh DOB: 11-10-1985 MRN: [id number]   Date of Service  10/26/2020  HPI/Events of Note  Pt experiencing generalized pruritis and puffy eyes  eICU Interventions  Prn benadryl ordered     Intervention Category Minor Interventions: Routine modifications to care plan (e.g. PRN medications for pain, fever)  Jacinta Shoe 10/26/2020, 5:45 AM

## 2020-10-27 MED ORDER — AMOXICILLIN-POT CLAVULANATE 875-125 MG PO TABS: 1.0000 | ORAL_TABLET | Freq: Two times a day (BID) | ORAL | 0 refills | Status: AC

## 2020-10-27 NOTE — Discharge Summary (Signed)
Physician Discharge Summary   Patient ID: James Walsh [id number] 35 y.o. 08/02/1985  Admit date: 10/23/2020  Discharge date and time: 10/27/20 12:02 PM   Admitting Physician: Charlotte Sanes, MD   Discharge Physician: Karren Burly, MD  Admission Diagnoses: Cardiac arrest [facility]) [I46.9] Acute respiratory failure with hypoxia (HCC) [J96.01] Opioid overdose, undetermined intent, initial encounter [facility]) [T40.2X4A]  Discharge Diagnoses: opioid overdose, aspiration pneumonia, acute hypoxemic respiratory failure  Admission Condition: critical  Discharged Condition: fair  Indication for Admission: cardiac arrest, respiratory failure   Hospital Course:   35 year old man admitted with respiratory depression/toxic metabolic encephalopathy and reported cardiac arrest.  He was placed on Narcan drip.  He is mental and respiratory status improved.  He was on high-dose Narcan drip for over 24 hours.  Slowly weaned.  Weaned off with appropriate mental status.  He had persistent ambulatory hypoxemia 9/21.  This improved on 9/22.  Likely due to aspiration pneumonia in the setting of his encephalopathy due to opiate overdose.  He is to complete 7-day course of Augmentin to end 10/29/2020.  Would recommend outpatient sleep study.  Consults: pulmonary/intensive care  Significant Diagnostic Studies: as per emr  Treatments: IV hydration and antibiotics  Discharge Exam: General: Walking around unit, no acute distress Eyes: EOMI, icterus Pulmonary: Normal breathing, ambulating, on room air Cardiovascular: warm, regular rate and rhythm on monitor  Disposition: Discharge disposition: [facility] Care Institution Not Defined      Patient Instructions:  Allergies as of 10/27/2020   No Known Allergies      Medication List     STOP taking these medications    busPIRone 30 MG tablet Commonly known as: BUSPAR   cholecalciferol 25 MCG (1000 UNIT) tablet Commonly known as:  VITAMIN D   escitalopram 20 MG tablet Commonly known as: LEXAPRO   ziprasidone 40 MG capsule Commonly known as: GEODON       TAKE these medications    amoxicillin-clavulanate 875-125 MG tablet Commonly known as: AUGMENTIN Take 1 tablet by mouth every 12 (twelve) hours for 3 days.       Activity: activity as tolerated Diet: regular diet Wound Care: none needed    SignedLesia Sago [facility] 10/27/2020 11:55 AM

## 2020-10-27 NOTE — Progress Notes (Signed)
  Patient Saturations on Room Air at Rest 93 % ambulated down the hall one lap, without complaints of sob or discomfort, maintain oxygen saturation 94% RA.

## 2020-10-27 NOTE — Progress Notes (Signed)
Alda Lea to be D/C'd back to jail per MD order.  Discussed with the patient and all questions fully answered.  VSS, Skin clean, dry and intact without evidence of skin break down, no evidence of skin tears noted. IV catheter discontinued intact. Site without signs and symptoms of complications. Dressing and pressure applied.  An After Visit Summary was printed and given to the patient. Patient received prescription.  D/c education completed with patient/family including follow up instructions, medication list, d/c activities limitations if indicated, with other d/c instructions as indicated by MD - patient able to verbalize understanding, all questions fully answered.   Patient instructed to return to ED, call 911, or call MD for any changes in condition.   Patient escorted by police officer back to jail.   Conley Canal Montejano 10/27/2020 12:20 PM

## 2020-10-31 LAB — CULTURE, BLOOD (ROUTINE X 2)
Culture: NO GROWTH
Culture: NO GROWTH
Special Requests: ADEQUATE
Special Requests: ADEQUATE

## 2020-11-15 ENCOUNTER — Inpatient Hospital Stay: Payer: Self-pay | Admitting: Physician Assistant

## 2023-12-01 ENCOUNTER — Encounter (HOSPITAL_COMMUNITY): Payer: Self-pay | Admitting: Emergency Medicine

## 2023-12-01 ENCOUNTER — Emergency Department (HOSPITAL_COMMUNITY)

## 2023-12-01 ENCOUNTER — Other Ambulatory Visit: Payer: Self-pay

## 2023-12-01 ENCOUNTER — Inpatient Hospital Stay (HOSPITAL_COMMUNITY)
Admission: EM | Admit: 2023-12-01 | Discharge: 2023-12-06 | DRG: 603 | Disposition: A | Discharge status: Home / Self Care | Attending: Internal Medicine | Admitting: Internal Medicine

## 2023-12-01 DIAGNOSIS — B9562 Methicillin resistant Staphylococcus aureus infection as the cause of diseases classified elsewhere: Secondary | ICD-10-CM | POA: Diagnosis present

## 2023-12-01 DIAGNOSIS — M79651 Pain in right thigh: Secondary | ICD-10-CM | POA: Diagnosis present

## 2023-12-01 DIAGNOSIS — F17210 Nicotine dependence, cigarettes, uncomplicated: Secondary | ICD-10-CM | POA: Diagnosis present

## 2023-12-01 DIAGNOSIS — E871 Hypo-osmolality and hyponatremia: Secondary | ICD-10-CM | POA: Diagnosis present

## 2023-12-01 DIAGNOSIS — Z8249 Family history of ischemic heart disease and other diseases of the circulatory system: Secondary | ICD-10-CM | POA: Diagnosis not present

## 2023-12-01 DIAGNOSIS — G8929 Other chronic pain: Secondary | ICD-10-CM | POA: Diagnosis present

## 2023-12-01 DIAGNOSIS — L03213 Periorbital cellulitis: Secondary | ICD-10-CM | POA: Diagnosis present

## 2023-12-01 DIAGNOSIS — F319 Bipolar disorder, unspecified: Secondary | ICD-10-CM | POA: Diagnosis present

## 2023-12-01 DIAGNOSIS — M549 Dorsalgia, unspecified: Secondary | ICD-10-CM | POA: Diagnosis present

## 2023-12-01 DIAGNOSIS — H00031 Abscess of right upper eyelid: Secondary | ICD-10-CM | POA: Diagnosis present

## 2023-12-01 DIAGNOSIS — M1990 Unspecified osteoarthritis, unspecified site: Secondary | ICD-10-CM | POA: Diagnosis present

## 2023-12-01 DIAGNOSIS — Z6832 Body mass index (BMI) 32.0-32.9, adult: Secondary | ICD-10-CM

## 2023-12-01 DIAGNOSIS — R229 Localized swelling, mass and lump, unspecified: Secondary | ICD-10-CM | POA: Diagnosis present

## 2023-12-01 DIAGNOSIS — E861 Hypovolemia: Secondary | ICD-10-CM | POA: Diagnosis present

## 2023-12-01 DIAGNOSIS — E669 Obesity, unspecified: Secondary | ICD-10-CM | POA: Diagnosis present

## 2023-12-01 LAB — CBC WITH DIFFERENTIAL/PLATELET
Abs Immature Granulocytes: 0.03 K/uL (ref 0.00–0.07)
Basophils Absolute: 0 K/uL (ref 0.0–0.1)
Basophils Relative: 0 %
Eosinophils Absolute: 0.3 K/uL (ref 0.0–0.5)
Eosinophils Relative: 3 %
HCT: 41.3 % (ref 39.0–52.0)
Hemoglobin: 14.2 g/dL (ref 13.0–17.0)
Immature Granulocytes: 0 %
Lymphocytes Relative: 20 %
Lymphs Abs: 1.6 K/uL (ref 0.7–4.0)
MCH: 32.8 pg (ref 26.0–34.0)
MCHC: 34.4 g/dL (ref 30.0–36.0)
MCV: 95.4 fL (ref 80.0–100.0)
Monocytes Absolute: 0.9 K/uL (ref 0.1–1.0)
Monocytes Relative: 11 %
Neutro Abs: 5.3 K/uL (ref 1.7–7.7)
Neutrophils Relative %: 66 %
Platelets: 125 K/uL — ABNORMAL LOW (ref 150–400)
RBC: 4.33 MIL/uL (ref 4.22–5.81)
RDW: 13.2 % (ref 11.5–15.5)
WBC: 8.1 K/uL (ref 4.0–10.5)
nRBC: 0 % (ref 0.0–0.2)

## 2023-12-01 LAB — BASIC METABOLIC PANEL WITH GFR
Anion gap: 10 (ref 5–15)
BUN: 9 mg/dL (ref 6–20)
CO2: 23 mmol/L (ref 22–32)
Calcium: 8.9 mg/dL (ref 8.9–10.3)
Chloride: 104 mmol/L (ref 98–111)
Creatinine, Ser: 1.14 mg/dL (ref 0.61–1.24)
GFR, Estimated: >60 mL/min (ref >60)
Glucose, Bld: 107 mg/dL — ABNORMAL HIGH (ref 70–99)
Potassium: 3.9 mmol/L (ref 3.5–5.1)
Sodium: 137 mmol/L (ref 135–145)

## 2023-12-01 LAB — I-STAT CHEM 8, ED
BUN: 9 mg/dL (ref 6–20)
Calcium, Ion: 1.16 mmol/L (ref 1.15–1.40)
Chloride: 104 mmol/L (ref 98–111)
Creatinine, Ser: 1.2 mg/dL (ref 0.61–1.24)
Glucose, Bld: 105 mg/dL — ABNORMAL HIGH (ref 70–99)
HCT: 42 % (ref 39.0–52.0)
Hemoglobin: 14.3 g/dL (ref 13.0–17.0)
Potassium: 4 mmol/L (ref 3.5–5.1)
Sodium: 141 mmol/L (ref 135–145)
TCO2: 22 mmol/L (ref 22–32)

## 2023-12-01 LAB — HIV ANTIBODY (ROUTINE TESTING W REFLEX): HIV Screen 4th Generation wRfx: NONREACTIVE

## 2023-12-01 LAB — MRSA NEXT GEN BY PCR, NASAL: MRSA by PCR Next Gen: DETECTED — AB

## 2023-12-01 MED ORDER — OXYCODONE HCL 5 MG PO TABS
5.0000 mg | ORAL_TABLET | ORAL | Status: DC | PRN
  Administered 2023-12-01 – 2023-12-02 (×6): 5 mg via ORAL
  Filled 2023-12-01 (×7): qty 1

## 2023-12-01 MED ORDER — VANCOMYCIN HCL 2000 MG/400ML IV SOLN
2000.0000 mg | Freq: Once | INTRAVENOUS | Status: AC
  Administered 2023-12-01: 2000 mg via INTRAVENOUS
  Filled 2023-12-01: qty 400

## 2023-12-01 MED ORDER — MUPIROCIN 2 % EX OINT
1.0000 | TOPICAL_OINTMENT | Freq: Two times a day (BID) | CUTANEOUS | Status: AC
  Administered 2023-12-01 – 2023-12-06 (×10): 1 via NASAL
  Filled 2023-12-01 (×5): qty 22

## 2023-12-01 MED ORDER — PIPERACILLIN-TAZOBACTAM 3.375 G IVPB
3.3750 g | Freq: Three times a day (TID) | INTRAVENOUS | Status: DC
  Administered 2023-12-01 – 2023-12-04 (×8): 3.375 g via INTRAVENOUS
  Filled 2023-12-01 (×9): qty 50

## 2023-12-01 MED ORDER — IOHEXOL 350 MG/ML SOLN
75.0000 mL | Freq: Once | INTRAVENOUS | Status: AC | PRN
  Administered 2023-12-01: 75 mL via INTRAVENOUS

## 2023-12-01 MED ORDER — VANCOMYCIN HCL 1750 MG/350ML IV SOLN
1750.0000 mg | INTRAVENOUS | Status: DC
  Administered 2023-12-02 – 2023-12-03 (×2): 1750 mg via INTRAVENOUS
  Filled 2023-12-01 (×3): qty 350

## 2023-12-01 MED ORDER — MORPHINE SULFATE (PF) 2 MG/ML IV SOLN
2.0000 mg | Freq: Once | INTRAVENOUS | Status: AC
  Administered 2023-12-01: 2 mg via INTRAVENOUS
  Filled 2023-12-01: qty 1

## 2023-12-01 MED ORDER — PIPERACILLIN-TAZOBACTAM 3.375 G IVPB 30 MIN
3.3750 g | Freq: Once | INTRAVENOUS | Status: AC
  Administered 2023-12-01: 3.375 g via INTRAVENOUS
  Filled 2023-12-01: qty 50

## 2023-12-01 MED ORDER — CHLORHEXIDINE GLUCONATE CLOTH 2 % EX PADS
6.0000 | MEDICATED_PAD | Freq: Every day | CUTANEOUS | Status: AC
  Administered 2023-12-02 – 2023-12-06 (×5): 6 via TOPICAL

## 2023-12-01 MED ORDER — ENOXAPARIN SODIUM 40 MG/0.4ML IJ SOSY
40.0000 mg | PREFILLED_SYRINGE | INTRAMUSCULAR | Status: DC
  Administered 2023-12-01: 40 mg via SUBCUTANEOUS
  Filled 2023-12-01: qty 0.4

## 2023-12-01 NOTE — Progress Notes (Signed)
 Pharmacy Antibiotic Note  James Walsh is a 38 y.o. male for which pharmacy has been consulted for vancomycin and zosyn dosing for preseptal cellulitis.  Estimated Creatinine Clearance: 94.1 mL/min (by C-G formula based on SCr of 1.2 mg/dL). WBC 8.1; T 97.9; HR 78; RR 16  Plan: Zosyn 3.375g IV q8h (4 hour infusion) Vancomycin 2000 mg once then 1750 mg q24hr (eAUC 507) unless change in renal function (BMI > 30; Vd 0.5) Monitor WBC, fever, renal function, cultures De-escalate when able Levels at steady state  Height: 5' 8 (172.7 cm) Weight: 96.6 kg (213 lb) IBW/kg (Calculated) : 68.4  Temp (24hrs), Avg:97.9 F (36.6 C), Min:97.9 F (36.6 C), Max:97.9 F (36.6 C)  Recent Labs  Lab 12/01/23 0948 12/01/23 0953  WBC 8.1  --   CREATININE 1.14 1.20    Estimated Creatinine Clearance: 94.1 mL/min (by C-G formula based on SCr of 1.2 mg/dL).    No Known Allergies  Microbiology results: Pending  Thank you for allowing pharmacy to be a part of this patient's care.  Dorn Buttner, PharmD, BCPS 12/01/2023 1:08 PM ED Clinical Pharmacist -  [phone]

## 2023-12-01 NOTE — Progress Notes (Signed)
 ED Pharmacy Antibiotic Sign Off An antibiotic consult was received from an ED provider for vancomycin and zosyn per pharmacy dosing for cellulitis. A chart review was completed to assess appropriateness.  The following one time order(s) were placed per pharmacy consult:  zosyn 3.375g x 1 dose vancomycin 2000 mg x 1 dose  Further antibiotic and/or antibiotic pharmacy consults should be ordered by the admitting provider if indicated.   Thank you for allowing pharmacy to be a part of this patient's care.   Dorn Buttner, PharmD, BCPS 12/01/2023 11:11 AM ED Clinical Pharmacist -  [phone]

## 2023-12-01 NOTE — Progress Notes (Signed)
 Received warning prior to starting Zosyn due to Augmentin  allergy. Per [place] it is okay to override warning to administer iv abx patient had a dose in ER this morning with no s/s of allergic reactions.

## 2023-12-01 NOTE — ED Triage Notes (Signed)
 Pt reports right eyelid swelling and redness for 2-3 days. Given tylenol  this am. Scheduled to start on abx today.

## 2023-12-01 NOTE — H&P (Signed)
 History and Physical    Patient: James Walsh FMW:[id number] DOB: 1985-03-03 DOA: 12/01/2023 DOS: the patient was seen and examined on 12/01/2023 PCP: Bertrum Charlie CROME, MD  Patient coming from: Home  Chief Complaint:  Chief Complaint  Patient presents with   Eye Pain   HPI: James Walsh is a 38 y.o. male with medical history significant of bipolar disorder p/w R eye preseptal cellulitis.  The patient presented with an abscess on the R eyelid that started two days ago. Initially, the abscess was small, approximately the size of the end of a pencil, but it had been getting larger and more swollen. The patient reported that the swelling worsened each time they woke up from sleep. The patient experienced significant pressure behind the abscess, which was described as crazy painful. The patient had not taken any medication for it initially but was given Claritin  for swelling, which did not alleviate the symptoms. This morning, the patient was prescribed Augmentin  and another antibiotic but had not taken them before being sent up for further evaluation.  In the ED, pt AFVSS. Labs unremarkable. CT orbit showed right periorbital and pre-septal soft tissue swelling compatible with cellulitis, and chronic right orbital floor fracture and reconstruction. EDP started IV abx (vancomycin and Zosyn) and requested medicine admission.    Review of Systems: As mentioned in the history of present illness. All other systems reviewed and are negative. Past Medical History:  Diagnosis Date   Arthritis    Bipolar disorder (HCC)    Chronic back pain    Mental disorder    Past Surgical History:  Procedure Laterality Date   EYE SURGERY Right    metal plate    FRACTURE SURGERY     right elbow   Social History:  reports that he has been smoking cigarettes. He uses smokeless tobacco. He reports current drug use. Drug: Oxycodone. He reports that he does not drink alcohol.  No Known  Allergies  Family History  Problem Relation Age of Onset   Heart attack Paternal Grandfather     Prior to Admission medications   Not on File    Physical Exam: Vitals:   12/01/23 0929 12/01/23 0930  BP: 112/74   Pulse: 78   Resp: 16   Temp: 97.9 F (36.6 C)   TempSrc: Oral   SpO2: 99%   Weight:  96.6 kg  Height:  5' 8 (1.727 m)   General: Alert, oriented x3, resting comfortably in no acute distress HEENT: EOMI w/o pain on movement Respiratory: Lungs clear to auscultation bilaterally with normal respiratory effort; no w/r/r Cardiovascular: Regular rate and rhythm w/o m/r/g   Data Reviewed:  Lab Results  Component Value Date   WBC 8.1 12/01/2023   HGB 14.3 12/01/2023   HCT 42.0 12/01/2023   MCV 95.4 12/01/2023   PLT 125 (L) 12/01/2023   Lab Results  Component Value Date   GLUCOSE 105 (H) 12/01/2023   CALCIUM 8.9 12/01/2023   NA 141 12/01/2023   K 4.0 12/01/2023   CO2 23 12/01/2023   CL 104 12/01/2023   BUN 9 12/01/2023   CREATININE 1.20 12/01/2023   Lab Results  Component Value Date   ALT 89 (H) 10/25/2020   AST 71 (H) 10/25/2020   ALKPHOS 25 (L) 10/25/2020   BILITOT 0.8 10/25/2020   No results found for: INR, PROTIME Radiology: CT Orbits W Contrast Result Date: 12/01/2023 EXAM: CT ORBITS WITH CONTRAST 12/01/2023 10:38:19 AM TECHNIQUE: CT of the orbits was performed  with the administration of 75 mL of iohexol  (OMNIPAQUE ) 350 MG/ML injection. Multiplanar reformatted images are provided for review. Automated exposure control, iterative reconstruction, and/or weight based adjustment of the mA/kV was utilized to reduce the radiation dose to as low as reasonably achievable. COMPARISON: Head CT 10/23/2020. CLINICAL HISTORY: 38 year old male with right orbital swelling, eyelid swelling, and redness for 2-3 days. FINDINGS: ORBITS: Right orbit: Globes are intact. Normal extraocular muscles. Normal optic nerve-sheath complexes. Chronic right orbital floor  fracture and ORIF, present on the 2022 comparison. Broad based right periorbital, preseptal space soft tissue swelling and stranding. No soft tissue gas. Right globe and postseptal soft tissues appear normal. No fluid collection. No mass. Left orbit: Globes are intact. Normal extraocular muscles. Normal optic nerve-sheath complexes. No hematoma or inflammatory change. No mass. SOFT TISSUES: Broad based right periorbital, preseptal space soft tissue swelling and stranding. No soft tissue gas. No upper cervical lymphadenopathy. SINUSES AND MASTOIDS: Well aerated bilateral paranasal sinuses, middle ears and mastoids. 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 No acute orbital wall fracture. VASCULATURE: Major vascular structures at the skull base are enhancing and patent. Dominant distal left vertebral artery, normal variant. BRAIN PARENCHYMA: Negative visible brain parenchyma. IMPRESSION: 1. Right periorbital and pre-septal soft tissue swelling compatible with cellulitis. 2. 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 Electronically signed by: Helayne Hurst MD 12/01/2023 10:45 AM EDT RP Workstation: [id number]    Assessment and Plan: 74M h/o bipolar disorder p/w R eye preseptal cellulitis.  R eye preseptal cellulitis Chronic R orbital fx -Ophthalmology consulted given chronic R orbital fx; apprec eval/recs -IV vancomycin/Zosyn for now; consider Augmentin  course on discharge (will defer to Ophthal) -F/u MRSA nares   Advance Care Planning:   Code Status: Full Code   Consults: Ophthal  Family Communication: N/A  Severity of Illness: The appropriate patient status for this patient is INPATIENT. Inpatient status is judged to be reasonable and necessary in order to provide the required intensity of service to ensure the patient's safety. The patient's presenting symptoms, physical exam findings, and initial radiographic and laboratory data in the  context of their chronic comorbidities is felt to place them at high risk for further clinical deterioration. Furthermore, it is not anticipated that the patient will be medically stable for discharge from the hospital within 2 midnights of admission.   * I certify that at the point of admission it is my clinical judgment that the patient will require inpatient hospital care spanning beyond 2 midnights from the point of admission due to high intensity of service, high risk for further deterioration and high frequency of surveillance required.*   ------- I spent 55 minutes reviewing previous notes, at the bedside counseling/discussing the treatment plan, and performing clinical documentation.  Author: Marsha Ada, MD 12/01/2023 12:46 PM  For on call review [email or web address].

## 2023-12-01 NOTE — ED Notes (Signed)
 Patient transported to CT

## 2023-12-01 NOTE — ED Provider Notes (Signed)
 [place] [facility] AT [facility] Provider Note   [name]: [id number] Arrival date & time: 12/01/23  9072     Patient presents with: Eye Pain   DEVERON SHAMOON is a 38 y.o. male patient who presents to the emergency department today for further evaluation of right eyelid swelling for 2 weeks.  He states that initially started as a bump on the upper lid on the right which is since progressed to significant swelling.  Patient able to lift normally secondary to swelling.  Does have some pain when he blinks.  No eye pain or visual deficits.  No fever or chills.    Eye Pain       Prior to Admission medications   Not on File    Allergies: Patient has no known allergies.    Review of Systems  Eyes:  Positive for pain.  All other systems reviewed and are negative.   Updated Vital Signs BP 112/74 (BP Location: Right Arm)   Pulse 78   Temp 97.9 F (36.6 C) (Oral)   Resp 16   Ht 5' 8 (1.727 m)   Wt 96.6 kg   SpO2 99%   BMI 32.39 kg/m   Physical Exam Vitals and nursing note reviewed.  Constitutional:      Appearance: Normal appearance.  HENT:     Head: Normocephalic and atraumatic.  Eyes:     General:        Right eye: No discharge.        Left eye: No discharge.     Conjunctiva/sclera: Conjunctivae normal.     Comments: Significant upper lid swelling with a small pustule at the 11 o'clock position.  Not actively draining.  Extraocular movements are intact without any entrapment or significant pain.  Pulmonary:     Effort: Pulmonary effort is normal.  Skin:    General: Skin is warm and dry.     Findings: No rash.  Neurological:     General: No focal deficit present.     Mental Status: He is alert.  Psychiatric:        Mood and Affect: Mood normal.        Behavior: Behavior normal.         (all labs ordered are listed, but only abnormal results are displayed) Labs Reviewed  CBC WITH DIFFERENTIAL/PLATELET - Abnormal; Notable for  the following components:      Result Value   Platelets 125 (*)    All other components within normal limits  BASIC METABOLIC PANEL WITH GFR - Abnormal; Notable for the following components:   Glucose, Bld 107 (*)    All other components within normal limits  I-STAT CHEM 8, ED - Abnormal; Notable for the following components:   Glucose, Bld 105 (*)    All other components within normal limits  HIV ANTIBODY (ROUTINE TESTING W REFLEX)  CBC  CREATININE, SERUM    EKG: None  Radiology: CT Orbits W Contrast Result Date: 12/01/2023 EXAM: CT ORBITS WITH CONTRAST 12/01/2023 10:38:19 AM TECHNIQUE: CT of the orbits was performed with the administration of 75 mL of iohexol  (OMNIPAQUE ) 350 MG/ML injection. Multiplanar reformatted images are provided for review. Automated exposure control, iterative reconstruction, and/or weight based adjustment of the mA/kV was utilized to reduce the radiation dose to as low as reasonably achievable. COMPARISON: Head CT 10/23/2020. CLINICAL HISTORY: 38 year old male with right orbital swelling, eyelid swelling, and redness for 2-3 days. FINDINGS: ORBITS: Right orbit: Globes are intact. Normal extraocular  muscles. Normal optic nerve-sheath complexes. 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 Broad based right periorbital, preseptal space soft tissue swelling and stranding. No soft tissue gas. Right globe and postseptal soft tissues appear normal. No fluid collection. No mass. Left orbit: Globes are intact. Normal extraocular muscles. Normal optic nerve-sheath complexes. No hematoma or inflammatory change. No mass. SOFT TISSUES: Broad based right periorbital, preseptal space soft tissue swelling and stranding. No soft tissue gas. No upper cervical lymphadenopathy. SINUSES AND MASTOIDS: Well aerated bilateral paranasal sinuses, middle ears and mastoids. 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 No  acute orbital wall fracture. VASCULATURE: Major vascular structures at the skull base are enhancing and patent. Dominant distal left vertebral artery, normal variant. BRAIN PARENCHYMA: Negative visible brain parenchyma. IMPRESSION: 1. Right periorbital and pre-septal soft tissue swelling compatible with cellulitis. 2. 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 Electronically signed by: Helayne Hurst MD 12/01/2023 10:45 AM EDT RP Workstation: [id number]     Procedures   Medications Ordered in the ED  piperacillin-tazobactam (ZOSYN) IVPB 3.375 g (3.375 g Intravenous New Bag/Given 12/01/23 1132)  vancomycin (VANCOREADY) IVPB 2000 mg/400 mL (2,000 mg Intravenous New Bag/Given 12/01/23 1132)  enoxaparin  (LOVENOX ) injection 40 mg (has no administration in time range)  iohexol  (OMNIPAQUE ) 350 MG/ML injection 75 mL (75 mLs Intravenous Contrast Given 12/01/23 1032)    Clinical Course as of 12/01/23 1202  Sun Dec 01, 2023  1152 I spoke with Dr. Georgina with [facility] who agrees to admit the patient. [CF]    Clinical Course User Index [CF] Theotis Cameron HERO, PA-C    Medical Decision Making Rayder Sullenger Torosyan is a 38 y.o. male patient who presents to the emergency department today for further evaluation of right sided eye pain and swelling.  This patient's workup was initiated in triage.  I ordered sebacic blood work and CT scan of the orbits with contrast to look for any signs of orbital cellulitis.  CT scan did show evidence of preseptal and periorbital cellulitis.  Given that the patient is coming from [facility].  Patient denies any trauma but there is some question around that.  He has had previous reconstruction surgery along the orbit.  Given the clinical scenario I do feel the patient would likely benefit from further evaluation inside the hospital for IV antibiotics for preseptal and periorbital cellulitis.  Patient agreeable with plan.  I spoke with the hospitalist who agrees to  admit.  Patient stable for admission at this time.   Amount and/or Complexity of Data Reviewed Labs: ordered. Radiology: ordered.  Risk Prescription drug management. Decision regarding hospitalization.     Final diagnoses:  Preseptal cellulitis    ED Discharge Orders     None          Theotis Cameron HERO, [place] 12/01/23 1202    Laurice Maude BROCKS, MD 12/01/23 [phone]

## 2023-12-02 DIAGNOSIS — L03213 Periorbital cellulitis: Secondary | ICD-10-CM | POA: Diagnosis not present

## 2023-12-02 MED ORDER — OXYCODONE HCL 5 MG PO TABS
5.0000 mg | ORAL_TABLET | ORAL | Status: DC | PRN
  Administered 2023-12-02 – 2023-12-03 (×6): 5 mg via ORAL
  Filled 2023-12-02 (×6): qty 1

## 2023-12-02 MED ORDER — ERYTHROMYCIN 5 MG/GM OP OINT
TOPICAL_OINTMENT | Freq: Three times a day (TID) | OPHTHALMIC | Status: DC
  Administered 2023-12-03 – 2023-12-05 (×9): 1 via OPHTHALMIC
  Filled 2023-12-02 (×2): qty 1

## 2023-12-02 MED ORDER — ACETAMINOPHEN 325 MG PO TABS
650.0000 mg | ORAL_TABLET | Freq: Once | ORAL | Status: AC
  Administered 2023-12-02: 650 mg via ORAL
  Filled 2023-12-02: qty 2

## 2023-12-02 MED ORDER — LIDOCAINE-EPINEPHRINE 2 %-1:100000 IJ SOLN
1.7000 mL | Freq: Once | INTRAMUSCULAR | Status: DC
  Filled 2023-12-02 (×2): qty 1.7

## 2023-12-02 MED ORDER — ENOXAPARIN SODIUM 60 MG/0.6ML IJ SOSY
50.0000 mg | PREFILLED_SYRINGE | INTRAMUSCULAR | Status: DC
  Administered 2023-12-02 – 2023-12-05 (×4): 50 mg via SUBCUTANEOUS
  Filled 2023-12-02 (×4): qty 0.6

## 2023-12-02 MED ORDER — ESCITALOPRAM OXALATE 20 MG PO TABS: 30.0000 mg | ORAL_TABLET | Freq: Every day | ORAL | Status: DC

## 2023-12-02 MED ORDER — ESCITALOPRAM OXALATE 10 MG PO TABS
10.0000 mg | ORAL_TABLET | Freq: Every day | ORAL | Status: DC
  Administered 2023-12-02 – 2023-12-05 (×4): 10 mg via ORAL
  Filled 2023-12-02 (×4): qty 1

## 2023-12-02 MED ORDER — ZIPRASIDONE HCL 40 MG PO CAPS
40.0000 mg | ORAL_CAPSULE | Freq: Two times a day (BID) | ORAL | Status: DC
  Administered 2023-12-02 – 2023-12-06 (×8): 40 mg via ORAL
  Filled 2023-12-02 (×8): qty 1

## 2023-12-02 MED ORDER — BUSPIRONE HCL 5 MG PO TABS
30.0000 mg | ORAL_TABLET | Freq: Two times a day (BID) | ORAL | Status: DC
  Administered 2023-12-02 – 2023-12-06 (×9): 30 mg via ORAL
  Filled 2023-12-02 (×9): qty 6

## 2023-12-02 NOTE — Plan of Care (Signed)

## 2023-12-02 NOTE — TOC Initial Note (Signed)
 Transition of Care (TOC) - Initial/Assessment Note   Patient from From [facility] . NCM called From [facility] spoke to [place].   Inpatient Care Management Team will continue to follow. Current plan is PO ABX at discharge.   At discharge NCM will call From [facility] [phone] to notify them patient is returning and fax discharge summary to From [facility] at [phone]. Bedside nurse will need to call From [facility] ay [phone] and ask for nurse to call report .    Patient Details  Name: James Walsh MRN: [id number] Date of Birth: 30-Mar-1985  Transition of Care [facility]) CM/SW Contact:    Stephane Powell Jansky, RN Phone Number: 12/02/2023, 10:16 AM  Clinical Narrative:                   Expected Discharge Plan: [facility] Barriers to Discharge: Continued Medical Work up   Patient Goals and CMS Choice     Choice offered to / list presented to : NA      Expected Discharge Plan and Services In-house Referral: Clinical Social Work Discharge Planning Services: CM Consult Post Acute Care Choice: NA Living arrangements for the past 2 months: [facility]                 DME Arranged: N/A DME Agency: NA       HH Arranged: NA HH Agency: NA        Prior Living Arrangements/Services Living arrangements for the past 2 months: [facility] Facility            Need for Family Participation in Patient Care:  (From [facility]) Care giver support system in place?:  (From [facility])   Criminal Activity/Legal Involvement Pertinent to Current Situation/Hospitalization:  (From [facility])  Activities of Daily Living   ADL Screening (condition at time of admission) Independently performs ADLs?: Yes (appropriate for developmental age) Is the  patient deaf or have difficulty hearing?: No Does the patient have difficulty seeing, even when wearing glasses/contacts?: No Does the patient have difficulty concentrating, remembering, or making decisions?: No  Permission Sought/Granted   Permission granted to share information with : Yes, Verbal Permission Granted     Permission granted to share info w AGENCY: [facility]        Emotional Assessment              Admission diagnosis:  Preseptal cellulitis [L03.213] Patient Active Problem List   Diagnosis Date Noted   Preseptal cellulitis 12/01/2023   AKI (acute kidney injury) 10/24/2020   Aspiration pneumonia of right lower lobe (HCC) 10/24/2020   Acute respiratory failure with hypoxia (HCC) 10/24/2020   Cardiac arrest (HCC) 10/23/2020   Hyperglycemia 10/23/2020   Overdose, undetermined intent, initial encounter 10/23/2020   Bipolar I disorder (HCC) 08/15/2014   Bipolar affective, mixed (HCC) 08/14/2014   Polysubstance abuse (HCC)    Substance induced mood disorder (HCC) 05/07/2013   Bipolar disorder, unspecified (HCC) 05/07/2013   Opiate abuse, continuous (HCC) 05/07/2013   Cannabis dependence (HCC) 05/07/2013   PCP:  Bertrum Charlie CROME, MD Pharmacy:  No Pharmacies Listed    Social Drivers of Health (SDOH) Social History: SDOH Screenings   Food Insecurity: No Food Insecurity (12/02/2023)  Housing: Low Risk  (12/02/2023)  Transportation Needs: No Transportation Needs (12/02/2023)  Utilities: Not At Risk (12/02/2023)  Tobacco Use: High Risk (12/01/2023)   SDOH Interventions:     Readmission Risk Interventions     No data to display
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
## 2023-12-02 NOTE — Plan of Care (Signed)
 Pt c/o headache this morning along with right eye pain. Swelling around the eye stable. Covering provider paged regarding headache. One time dose of 650mg  tylenol  given per order with little improvement on headache. Pt also c/o 9-10/10 right eye pain throughout the night but comfortable in the bed in NAD.    Problem: Education: Goal: Knowledge of General Education information will improve Description: Including pain rating scale, medication(s)/side effects and non-pharmacologic comfort measures Outcome: Progressing   Problem: Health Behavior/Discharge Planning: Goal: Ability to manage health-related needs will improve Outcome: Progressing   Problem: Clinical Measurements: Goal: Ability to maintain clinical measurements within normal limits will improve Outcome: Progressing Goal: Will remain free from infection Outcome: Progressing Goal: Diagnostic test results will improve Outcome: Progressing Goal: Respiratory complications will improve Outcome: Progressing Goal: Cardiovascular complication will be avoided Outcome: Progressing   Problem: Activity: Goal: Risk for activity intolerance will decrease Outcome: Progressing   Problem: Nutrition: Goal: Adequate nutrition will be maintained Outcome: Progressing   Problem: Coping: Goal: Level of anxiety will decrease Outcome: Progressing   Problem: Elimination: Goal: Will not experience complications related to bowel motility Outcome: Progressing Goal: Will not experience complications related to urinary retention Outcome: Progressing   Problem: Pain Managment: Goal: General experience of comfort will improve and/or be controlled Outcome: Progressing   Problem: Safety: Goal: Ability to remain free from injury will improve Outcome: Progressing   Problem: Skin Integrity: Goal: Risk for impaired skin integrity will decrease Outcome: Progressing

## 2023-12-02 NOTE — Consult Note (Signed)
 Ophthalmology Consult Note  Subjective: Patient with right upper eyelid and brow abscess and cellulitis. Patient reports that it started 3 days ago as a bump the size of a pencil that enlarged and became more painful. Reports a pressure behind his eyes. Reports he cannot see due to the eyelid swelling which is obstructing his vision. When his eye is manually opened he denies any changes in vision in his right eye. Reports that vision is diminished in his right eye due to remote trauma/orbital fractures. No new vision changes.  Objective: Vital signs in last 24 hours: Temp:  [97.5 F (36.4 C)-98.6 F (37 C)] 98.4 F (36.9 C) (10/27 1725) Pulse Rate:  [75-102] 75 (10/27 1725) Resp:  [17-20] 17 (10/27 1725) BP: (111-146)/(77-96) 123/84 (10/27 1725) SpO2:  [93 %-96 %] 94 % (10/27 1725) Weight change:  Last BM Date : 12/01/23  Intake/Output from previous day: 10/26 0701 - 10/27 0700 In: 338.7 [P.O.:240; IV Piggyback:98.7] Out: -  Intake/Output this shift: No intake/output data recorded.  Base Eye Exam  Visual Acuity (ETDRS)   Right Left  Near 20/ 20/25  Dist ph [place]     Tonometry (Tonopen)   Right Left  Pressure 8 15   Pupils   APD  Right None  Left None   Extraocular Movement   Right Left   Full Full   Neuro/Psych  Oriented x3: Yes  Mood/Affect: Normal         Slit Lamp and Fundus Exam   External Exam   Right Left  External Large abscess with cellulitis of lateral upper right eyelid Normal   Slit Lamp Exam   Right Left  Lids/Lashes Normal Normal  Conjunctiva/Sclera White and quiet White and quiet  Cornea Clear Clear  Anterior Chamber Deep and quiet Deep and quiet  Iris Grossly normal Grossly normal  Lens            Recent Labs    12/01/23 0948 12/01/23 0953  WBC 8.1  --   HGB 14.2 14.3  HCT 41.3 42.0  NA 137 141  K 3.9 4.0  CL 104 104  CO2 23  --   BUN 9 9  CREATININE 1.14 1.20    Studies/Results: CT Orbits W Contrast Result  Date: 12/01/2023 EXAM: CT ORBITS WITH CONTRAST 12/01/2023 10:38:19 AM TECHNIQUE: CT of the orbits was performed with the administration of 75 mL of iohexol  (OMNIPAQUE ) 350 MG/ML injection. Multiplanar reformatted images are provided for review. Automated exposure control, iterative reconstruction, and/or weight based adjustment of the mA/kV was utilized to reduce the radiation dose to as low as reasonably achievable. COMPARISON: Head CT 10/23/2020. CLINICAL HISTORY: 38 year old male with right orbital swelling, eyelid swelling, and redness for 2-3 days. FINDINGS: ORBITS: Right orbit: Globes are intact. Normal extraocular muscles. Normal optic nerve-sheath complexes. 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 Broad based right periorbital, preseptal space soft tissue swelling and stranding. No soft tissue gas. Right globe and postseptal soft tissues appear normal. No fluid collection. No mass. Left orbit: Globes are intact. Normal extraocular muscles. Normal optic nerve-sheath complexes. No hematoma or inflammatory change. No mass. SOFT TISSUES: Broad based right periorbital, preseptal space soft tissue swelling and stranding. No soft tissue gas. No upper cervical lymphadenopathy. SINUSES AND MASTOIDS: Well aerated bilateral paranasal sinuses, middle ears and mastoids. 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 No acute orbital wall fracture. VASCULATURE: Major vascular structures at the skull base are  enhancing and patent. Dominant distal left vertebral artery, normal variant. BRAIN PARENCHYMA: Negative visible brain parenchyma. IMPRESSION: 1. Right periorbital and pre-septal soft tissue swelling compatible with cellulitis. 2. 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 Electronically signed by: Helayne Hurst MD 12/01/2023 10:45 AM EDT RP Workstation: [id number]    Medications: I have reviewed the patient's current  medications.  PROCEDURE NOTE:   Diagnosis: abscess - Location: Right upper eyelid Procedure: Incision & drainage Informed consent:  Discussed risks (permanent loss of vision, scarring, bleeding, pain, risk of infection spreading, damage to adjacent structures, recurrence of abscess) and benefits (relief of pain, swelling, infection, worsening infection and sepsis) of the procedure, as well as the alternatives.  Informed consent was obtained. Anesthesia: Local anesthesia with 2% Lidocaine  with epinephrine   After the area was sterilized, 2% lidocaine  with epinephrine  was injected into the area for local anesthesia. The abscess was incised with an #11 blade. The contents were expressed and cultured. The wound was left to continue to drain and heal by secondary intention.  The patient tolerated the procedure well. The patient was instructed on post-op care.    Assessment/Plan:  Right upper eyelid abscess with preseptal cellulitis  -Worsening over the past 3 days with pain and pressure. Affecting the vision by obscuration of the pupil -Recommend incision and drain given large abscess -Risks, benefits and alternatives were reviewed with the patient. He would like to proceed with drainage of the abscess. See procedure note above. -Culture taken -Recommend continuing with broad spectrum antibiotics per primary team and wait for cultures results. -Reconsult ophthalmology if patient worsens.   LOS: 1 day   Dicky Boer T Emani Taussig 12/02/2023

## 2023-12-03 DIAGNOSIS — L03213 Periorbital cellulitis: Secondary | ICD-10-CM | POA: Diagnosis not present

## 2023-12-03 LAB — CBC
HCT: 42.3 % (ref 39.0–52.0)
Hemoglobin: 14.6 g/dL (ref 13.0–17.0)
MCH: 32.6 pg (ref 26.0–34.0)
MCHC: 34.5 g/dL (ref 30.0–36.0)
MCV: 94.4 fL (ref 80.0–100.0)
Platelets: 140 K/uL — ABNORMAL LOW (ref 150–400)
RBC: 4.48 MIL/uL (ref 4.22–5.81)
RDW: 13.2 % (ref 11.5–15.5)
WBC: 9.3 K/uL (ref 4.0–10.5)
nRBC: 0 % (ref 0.0–0.2)

## 2023-12-03 LAB — BASIC METABOLIC PANEL WITH GFR
Anion gap: 8 (ref 5–15)
BUN: 10 mg/dL (ref 6–20)
CO2: 24 mmol/L (ref 22–32)
Calcium: 9 mg/dL (ref 8.9–10.3)
Chloride: 101 mmol/L (ref 98–111)
Creatinine, Ser: 1.19 mg/dL (ref 0.61–1.24)
GFR, Estimated: >60 mL/min (ref >60)
Glucose, Bld: 96 mg/dL (ref 70–99)
Potassium: 4 mmol/L (ref 3.5–5.1)
Sodium: 133 mmol/L — ABNORMAL LOW (ref 135–145)

## 2023-12-03 MED ORDER — PROCHLORPERAZINE EDISYLATE 10 MG/2ML IJ SOLN: 5.0000 mg | Freq: Four times a day (QID) | INTRAMUSCULAR | Status: DC | PRN

## 2023-12-03 MED ORDER — SENNOSIDES-DOCUSATE SODIUM 8.6-50 MG PO TABS
1.0000 | ORAL_TABLET | Freq: Two times a day (BID) | ORAL | Status: DC
  Administered 2023-12-03: 1 via ORAL
  Filled 2023-12-03: qty 1

## 2023-12-03 MED ORDER — HYDROMORPHONE HCL 1 MG/ML IJ SOLN
0.5000 mg | INTRAMUSCULAR | Status: AC
  Administered 2023-12-03: 0.5 mg via INTRAVENOUS
  Filled 2023-12-03: qty 0.5

## 2023-12-03 MED ORDER — HYDROMORPHONE HCL 1 MG/ML IJ SOLN: 0.5000 mg | INTRAMUSCULAR | Status: DC | PRN

## 2023-12-03 MED ORDER — MELATONIN 5 MG PO TABS
5.0000 mg | ORAL_TABLET | Freq: Every evening | ORAL | Status: DC | PRN
  Administered 2023-12-04: 5 mg via ORAL
  Filled 2023-12-03: qty 1

## 2023-12-03 MED ORDER — SODIUM CHLORIDE 0.9 % IV SOLN: INTRAVENOUS | Status: DC

## 2023-12-03 MED ORDER — SENNOSIDES-DOCUSATE SODIUM 8.6-50 MG PO TABS
1.0000 | ORAL_TABLET | Freq: Two times a day (BID) | ORAL | Status: DC
  Administered 2023-12-03 – 2023-12-06 (×6): 1 via ORAL
  Filled 2023-12-03 (×6): qty 1

## 2023-12-03 MED ORDER — LACTATED RINGERS IV SOLN: INTRAVENOUS | Status: DC

## 2023-12-03 MED ORDER — OXYCODONE HCL 5 MG PO TABS
5.0000 mg | ORAL_TABLET | ORAL | Status: DC | PRN
  Administered 2023-12-03 – 2023-12-04 (×5): 10 mg via ORAL
  Filled 2023-12-03 (×5): qty 2

## 2023-12-03 MED ORDER — POLYETHYLENE GLYCOL 3350 17 G PO PACK: 17.0000 g | PACK | Freq: Every day | ORAL | Status: DC | PRN
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
## 2023-12-04 DIAGNOSIS — L03213 Periorbital cellulitis: Secondary | ICD-10-CM | POA: Diagnosis not present

## 2023-12-04 MED ORDER — METRONIDAZOLE 500 MG PO TABS
500.0000 mg | ORAL_TABLET | Freq: Two times a day (BID) | ORAL | Status: DC
Start: 2023-12-04 — End: 2023-12-05
  Administered 2023-12-04 – 2023-12-05 (×3): 500 mg via ORAL
  Filled 2023-12-04 (×3): qty 1

## 2023-12-04 MED ORDER — ACETAMINOPHEN 500 MG PO TABS
1000.0000 mg | ORAL_TABLET | Freq: Four times a day (QID) | ORAL | Status: DC | PRN
  Administered 2023-12-04 – 2023-12-06 (×2): 1000 mg via ORAL
  Filled 2023-12-04 (×2): qty 2

## 2023-12-04 MED ORDER — IBUPROFEN 600 MG PO TABS
600.0000 mg | ORAL_TABLET | Freq: Four times a day (QID) | ORAL | Status: DC | PRN
  Administered 2023-12-04 – 2023-12-05 (×2): 600 mg via ORAL
  Filled 2023-12-04 (×2): qty 1

## 2023-12-04 MED ORDER — CEFUROXIME AXETIL 250 MG PO TABS
500.0000 mg | ORAL_TABLET | Freq: Two times a day (BID) | ORAL | Status: DC
  Administered 2023-12-04 – 2023-12-05 (×3): 500 mg via ORAL
  Filled 2023-12-04 (×3): qty 2

## 2023-12-04 MED ORDER — LINEZOLID 600 MG PO TABS
600.0000 mg | ORAL_TABLET | Freq: Two times a day (BID) | ORAL | Status: DC
  Administered 2023-12-04 – 2023-12-06 (×4): 600 mg via ORAL
  Filled 2023-12-04 (×4): qty 1

## 2023-12-04 MED ORDER — TAMSULOSIN HCL 0.4 MG PO CAPS
0.4000 mg | ORAL_CAPSULE | Freq: Every day | ORAL | Status: DC
  Administered 2023-12-04 – 2023-12-05 (×2): 0.4 mg via ORAL
  Filled 2023-12-04 (×2): qty 1

## 2023-12-04 MED ORDER — LINEZOLID 600 MG/300ML IV SOLN
600.0000 mg | Freq: Two times a day (BID) | INTRAVENOUS | Status: DC
  Administered 2023-12-04: 600 mg via INTRAVENOUS
  Filled 2023-12-04: qty 300

## 2023-12-04 MED ORDER — OXYCODONE HCL 5 MG PO TABS
5.0000 mg | ORAL_TABLET | ORAL | Status: DC | PRN
  Administered 2023-12-04 – 2023-12-05 (×7): 10 mg via ORAL
  Administered 2023-12-05: 5 mg via ORAL
  Administered 2023-12-06 (×2): 10 mg via ORAL
  Filled 2023-12-04 (×7): qty 2
  Filled 2023-12-04: qty 1
  Filled 2023-12-04 (×3): qty 2

## 2023-12-04 MED ORDER — VITAMIN D 25 MCG (1000 UNIT) PO TABS
2000.0000 [IU] | ORAL_TABLET | Freq: Every morning | ORAL | Status: DC
  Administered 2023-12-04 – 2023-12-06 (×3): 2000 [IU] via ORAL
  Filled 2023-12-04 (×3): qty 2

## 2023-12-04 NOTE — Plan of Care (Signed)

## 2023-12-04 NOTE — Plan of Care (Signed)

## 2023-12-04 NOTE — TOC Progression Note (Addendum)
 Transition of Care (TOC) - Progression Note   Anticipated discharge 1 to 2 days, on PO Zyvox.   NCM called [facility]  [phone] to confirm they can provide PO Zyvox. Spoke to [place] , she will have provider call NCM back.   Nicholaus with [facility] called back. They do not have Zyvox in stock. Nicholaus asked if hospital pharmacy can send medications with patient at discharge, and bill medications to patient's inpatient stay. NCM secure chatted MD and [facility] pharmacy   MD will send prescriptions to [facility] at discharge. Confirmed with TOC Pharamcy , that [facility] can send a bill to the corrections facility for the discharge meds.  Patient Details  Name: James Walsh MRN: [id number] Date of Birth: [birth year]  Transition of Care [facility]) CM/SW Contact  Pharell Rolfson, Powell Jansky, RN Phone Number: 12/04/2023, 9:49 AM  Clinical Narrative:       Expected Discharge Plan: [facility] Barriers to Discharge: Continued Medical Work up               Expected Discharge Plan and Services In-house Referral: Clinical Social Work Discharge Planning Services: CM Consult Post Acute Care Choice: NA Living arrangements for the past 2 months: Holiday Representative                 DME Arranged: N/A DME Agency: NA       HH Arranged: NA HH Agency: NA         Social Drivers of Health (SDOH) Interventions SDOH Screenings   Food Insecurity: No Food Insecurity (12/02/2023)  Housing: Low Risk  (12/02/2023)  Transportation Needs: No Transportation Needs (12/02/2023)  Utilities: Not At Risk (12/02/2023)  Tobacco Use: High Risk (12/01/2023)    Readmission Risk Interventions     No data to display
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
## 2023-12-05 ENCOUNTER — Other Ambulatory Visit (HOSPITAL_COMMUNITY): Payer: Self-pay

## 2023-12-05 DIAGNOSIS — L03213 Periorbital cellulitis: Secondary | ICD-10-CM | POA: Diagnosis not present

## 2023-12-05 LAB — BASIC METABOLIC PANEL WITH GFR
Anion gap: 13 (ref 5–15)
BUN: 9 mg/dL (ref 6–20)
CO2: 25 mmol/L (ref 22–32)
Calcium: 9.1 mg/dL (ref 8.9–10.3)
Chloride: 99 mmol/L (ref 98–111)
Creatinine, Ser: 1.12 mg/dL (ref 0.61–1.24)
GFR, Estimated: >60 mL/min (ref >60)
Glucose, Bld: 86 mg/dL (ref 70–99)
Potassium: 4 mmol/L (ref 3.5–5.1)
Sodium: 137 mmol/L (ref 135–145)

## 2023-12-05 LAB — AEROBIC CULTURE W GRAM STAIN (SUPERFICIAL SPECIMEN)

## 2023-12-05 LAB — CBC
HCT: 41.8 % (ref 39.0–52.0)
Hemoglobin: 14.4 g/dL (ref 13.0–17.0)
MCH: 32.7 pg (ref 26.0–34.0)
MCHC: 34.4 g/dL (ref 30.0–36.0)
MCV: 95 fL (ref 80.0–100.0)
Platelets: 159 K/uL (ref 150–400)
RBC: 4.4 MIL/uL (ref 4.22–5.81)
RDW: 12.8 % (ref 11.5–15.5)
WBC: 5.2 K/uL (ref 4.0–10.5)
nRBC: 0 % (ref 0.0–0.2)
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
## 2023-12-05 NOTE — Plan of Care (Signed)

## 2023-12-05 NOTE — Progress Notes (Signed)
 Subjective: Reports improvement in pain. States he wants to be discharged. Still no changes in vision.  Objective: Vital signs in last 24 hours: Temp:  [97.6 F (36.4 C)-98.4 F (36.9 C)] 97.6 F (36.4 C) (10/30 1807) Pulse Rate:  [67-84] 84 (10/30 1807) Resp:  [17-19] 19 (10/30 1807) BP: (106-124)/(69-85) 106/69 (10/30 1807) SpO2:  [93 %-95 %] 94 % (10/30 1807) Weight change:  Last BM Date : 12/03/23  Intake/Output from previous day: 10/29 0701 - 10/30 0700 In: 1210 [P.O.:1210] Out: 1900 [Urine:1900] Intake/Output this shift: Total I/O In: 700 [P.O.:700] Out: -   External Exam     Right Left  External Erythema of upper and lower eyelid, less indurated. Skin laxity due to swelling improvement Normal    Slit Lamp Exam     Right Left  Lids/Lashes Normal Normal  Conjunctiva/Sclera White and quiet White and quiet  Cornea Clear Clear  Anterior Chamber Deep and quiet Deep and quiet  Iris Grossly normal Grossly normal  Lens                  Recent Labs    12/03/23 0316 12/05/23 0359  WBC 9.3 5.2  HGB 14.6 14.4  HCT 42.3 41.8  NA 133* 137  K 4.0 4.0  CL 101 99  CO2 24 25  BUN 10 9  CREATININE 1.19 1.12    Studies/Results: No results found.  Medications: I have reviewed the patient's current medications.  Assessment/Plan:  Right upper eyelid abscess with preseptal cellulitis   -Worsening over the past 3 days with pain and pressure. Affecting the vision by obscuration of the pupil -s/p I&D -Culture grew MRSA. Significant improvement after Abx and I&D. Ok to continue oral antibiotics Zyvox upon discharge. Ok to discharge given improvement   LOS: 4 days   Less Woolsey T Lesley Atkin 12/05/2023

## 2023-12-05 NOTE — Plan of Care (Signed)
   Problem: Education: Goal: Knowledge of General Education information will improve Description Including pain rating scale, medication(s)/side effects and non-pharmacologic comfort measures Outcome: Progressing

## 2023-12-06 ENCOUNTER — Other Ambulatory Visit (HOSPITAL_COMMUNITY): Payer: Self-pay

## 2023-12-06 DIAGNOSIS — L03213 Periorbital cellulitis: Secondary | ICD-10-CM | POA: Diagnosis not present

## 2023-12-06 MED ORDER — IBUPROFEN 600 MG PO TABS: 600.0000 mg | ORAL_TABLET | Freq: Four times a day (QID) | ORAL | Status: AC | PRN

## 2023-12-06 MED ORDER — LINEZOLID 600 MG PO TABS
600.0000 mg | ORAL_TABLET | Freq: Two times a day (BID) | ORAL | 0 refills | Status: DC
  Filled 2023-12-06: qty 24, 12d supply, fill #0

## 2023-12-06 MED ORDER — LINEZOLID 600 MG PO TABS
600.0000 mg | ORAL_TABLET | Freq: Two times a day (BID) | ORAL | 0 refills | Status: AC
  Filled 2023-12-06: qty 16, 8d supply, fill #0

## 2023-12-06 MED ORDER — ERYTHROMYCIN 5 MG/GM OP OINT
TOPICAL_OINTMENT | Freq: Three times a day (TID) | OPHTHALMIC | 0 refills | Status: AC
  Filled 2023-12-06: qty 3.5, 7d supply, fill #0

## 2023-12-06 NOTE — Plan of Care (Signed)
  Problem: Education: Goal: Knowledge of General Education information will improve Description: Including pain rating scale, medication(s)/side effects and non-pharmacologic comfort measures Outcome: Progressing   Problem: Coping: Goal: Level of anxiety will decrease Outcome: Progressing   Problem: Pain Managment: Goal: General experience of comfort will improve and/or be controlled Outcome: Progressing
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
## 2023-12-15 NOTE — Progress Notes (Deleted)
    Chief Complaint: Urinary difficulty  History of Present Illness:  38 yo male inmate @ [facility] is referred for E/M of LUTS.   Past Medical History:  Past Medical History:  Diagnosis Date   Arthritis    Bipolar disorder (HCC)    Chronic back pain    Mental disorder     Past Surgical History:  Past Surgical History:  Procedure Laterality Date   EYE SURGERY Right    metal plate    FRACTURE SURGERY     right elbow    Allergies:  Allergies  Allergen Reactions   Augmentin  [Amoxicillin -Pot Clavulanate] Hives and Itching    Family History:  Family History  Problem Relation Age of Onset   Heart attack Paternal Grandfather     Social History:  Social History   Tobacco Use   Smoking status: Every Day    Current packs/day: 1.00    Types: Cigarettes   Smokeless tobacco: Current  Substance Use Topics   Alcohol use: No    Comment: occasional   Drug use: Yes    Types: Oxycodone    Comment: 08/13/2014 denies drugs, uses pills mentioned in traige note __ denies 05/01/14 --(05/2013)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    Review of symptoms:  Constitutional:  Negative for unexplained weight loss, night sweats, fever, chills ENT:  Negative for nose bleeds, sinus pain, painful swallowing CV:  Negative for chest pain, shortness of breath, exercise intolerance, palpitations, loss of consciousness Resp:  Negative for cough, wheezing, shortness of breath GI:  Negative for nausea, vomiting, diarrhea, bloody stools GU:  Positives noted in HPI; otherwise negative for gross hematuria, dysuria, urinary incontinence Neuro:  Negative for seizures, poor balance, limb weakness, slurred speech Psych:  Negative for lack of energy, depression, anxiety Endocrine:  Negative for polydipsia, polyuria, symptoms of hypoglycemia (dizziness, hunger, sweating) Hematologic:  Negative for anemia, purpura, petechia,  prolonged or excessive bleeding, use of anticoagulants  Allergic:  Negative for difficulty breathing or choking as a result of exposure to anything; no shellfish allergy; no allergic response (rash/itch) to materials, foods  Physical exam: There were no vitals taken for this visit. GENERAL APPEARANCE:  Well appearing, well developed, well nourished, NAD HEENT: Atraumatic, Normocephalic. NECK: Normal appearance LUNGS: Normal inspiratory and expiratory excursion HEART: Regular Rate ABDOMEN: ***. GU: Phallus normal, no lesions. Scrotal skin normal. Testicles/epididymal structures normal. Meatus normal. Normal anal sphincter tone, prostate ***mL, symmetric, non nodular, non tender. EXTREMITIES: Moves all extremities well.  Without clubbing, cyanosis, or edema. NEUROLOGIC:  Alert and oriented x 3, normal gait, CN II-XII grossly intact.  MENTAL STATUS:  Appropriate. SKIN:  Warm, dry and intact.    Results: No results found for this or any previous visit (from the past 24 hours).  I have reviewed referring/prior physicians notes  I have reviewed urinalysis  I have reviewed PSA results  I have reviewed prior imaging  I have reviewed urine culture results  Assessment: ***   Plan: ***

## 2023-12-16 ENCOUNTER — Ambulatory Visit: Admitting: Urology

## 2023-12-16 DIAGNOSIS — R399 Unspecified symptoms and signs involving the genitourinary system: Secondary | ICD-10-CM

## 2024-04-01 ENCOUNTER — Ambulatory Visit: Admitting: Urology
